# Patient Record
Sex: Female | Born: 1945 | Race: White | Hispanic: No | Marital: Married | Smoking: Former smoker
Health system: Southern US, Community
[De-identification: ages and names within clinical notes are randomized; demographics above are authoritative.]

## PROBLEM LIST (undated history)

## (undated) DIAGNOSIS — R55 Syncope and collapse: Secondary | ICD-10-CM

## (undated) DIAGNOSIS — F32A Depression, unspecified: Secondary | ICD-10-CM

## (undated) DIAGNOSIS — D61818 Other pancytopenia: Secondary | ICD-10-CM

## (undated) DIAGNOSIS — I4891 Unspecified atrial fibrillation: Secondary | ICD-10-CM

## (undated) DIAGNOSIS — E860 Dehydration: Secondary | ICD-10-CM

## (undated) DIAGNOSIS — L89152 Pressure ulcer of sacral region, stage 2: Secondary | ICD-10-CM

## (undated) DIAGNOSIS — G7000 Myasthenia gravis without (acute) exacerbation: Secondary | ICD-10-CM

## (undated) DIAGNOSIS — F419 Anxiety disorder, unspecified: Secondary | ICD-10-CM

## (undated) DIAGNOSIS — G20A1 Parkinson's disease without dyskinesia, without mention of fluctuations: Secondary | ICD-10-CM

## (undated) DIAGNOSIS — G20 Parkinson's disease: Secondary | ICD-10-CM

## (undated) DIAGNOSIS — E871 Hypo-osmolality and hyponatremia: Secondary | ICD-10-CM

## (undated) DIAGNOSIS — R1310 Dysphagia, unspecified: Secondary | ICD-10-CM

## (undated) DIAGNOSIS — E039 Hypothyroidism, unspecified: Secondary | ICD-10-CM

## (undated) DIAGNOSIS — N3000 Acute cystitis without hematuria: Secondary | ICD-10-CM

## (undated) DIAGNOSIS — F329 Major depressive disorder, single episode, unspecified: Secondary | ICD-10-CM

## (undated) HISTORY — DX: Hypo-osmolality and hyponatremia: E87.1

## (undated) HISTORY — DX: Depression, unspecified: F32.A

## (undated) HISTORY — DX: Dehydration: E86.0

## (undated) HISTORY — PX: ABDOMINAL HYSTERECTOMY: SHX81

## (undated) HISTORY — DX: Parkinson's disease: G20

## (undated) HISTORY — DX: Acute cystitis without hematuria: N30.00

## (undated) HISTORY — DX: Unspecified atrial fibrillation: I48.91

## (undated) HISTORY — DX: Pressure ulcer of sacral region, stage 2: L89.152

## (undated) HISTORY — DX: Hypomagnesemia: E83.42

## (undated) HISTORY — DX: Other pancytopenia: D61.818

## (undated) HISTORY — DX: Parkinson's disease without dyskinesia, without mention of fluctuations: G20.A1

## (undated) HISTORY — DX: Myasthenia gravis without (acute) exacerbation: G70.00

## (undated) HISTORY — DX: Anxiety disorder, unspecified: F41.9

## (undated) HISTORY — DX: Dysphagia, unspecified: R13.10

## (undated) HISTORY — DX: Syncope and collapse: R55

## (undated) HISTORY — DX: Hypothyroidism, unspecified: E03.9

## (undated) HISTORY — DX: Major depressive disorder, single episode, unspecified: F32.9

---

## 2008-07-12 ENCOUNTER — Encounter: Admission: RE | Admit: 2008-07-12 | Discharge: 2008-07-12 | Payer: Self-pay | Admitting: Family Medicine

## 2008-07-16 ENCOUNTER — Encounter: Admission: RE | Admit: 2008-07-16 | Discharge: 2008-07-16 | Payer: Self-pay | Admitting: Family Medicine

## 2009-07-14 ENCOUNTER — Encounter
Admission: RE | Admit: 2009-07-14 | Discharge: 2009-07-14 | Payer: Self-pay | Source: Home / Self Care | Admitting: Family Medicine

## 2010-06-06 ENCOUNTER — Encounter
Admission: RE | Admit: 2010-06-06 | Discharge: 2010-06-15 | Payer: Self-pay | Source: Home / Self Care | Attending: Family Medicine | Admitting: Family Medicine

## 2010-06-20 ENCOUNTER — Encounter
Admission: RE | Admit: 2010-06-20 | Discharge: 2010-07-18 | Payer: Self-pay | Source: Home / Self Care | Attending: Family Medicine | Admitting: Family Medicine

## 2010-07-08 ENCOUNTER — Other Ambulatory Visit: Payer: Self-pay | Admitting: Family Medicine

## 2010-07-08 DIAGNOSIS — Z1239 Encounter for other screening for malignant neoplasm of breast: Secondary | ICD-10-CM

## 2010-07-20 ENCOUNTER — Ambulatory Visit: Payer: BC Managed Care – HMO | Attending: Family Medicine | Admitting: Physical Therapy

## 2010-07-20 DIAGNOSIS — M542 Cervicalgia: Secondary | ICD-10-CM | POA: Insufficient documentation

## 2010-07-20 DIAGNOSIS — IMO0001 Reserved for inherently not codable concepts without codable children: Secondary | ICD-10-CM | POA: Insufficient documentation

## 2010-07-20 DIAGNOSIS — M2569 Stiffness of other specified joint, not elsewhere classified: Secondary | ICD-10-CM | POA: Insufficient documentation

## 2010-07-21 ENCOUNTER — Ambulatory Visit
Admission: RE | Admit: 2010-07-21 | Discharge: 2010-07-21 | Disposition: A | Discharge status: Home / Self Care | Payer: BC Managed Care – PPO | Source: Ambulatory Visit | Attending: Family Medicine | Admitting: Family Medicine

## 2010-07-21 DIAGNOSIS — Z1239 Encounter for other screening for malignant neoplasm of breast: Secondary | ICD-10-CM

## 2010-07-26 ENCOUNTER — Ambulatory Visit: Payer: BC Managed Care – HMO | Admitting: Physical Therapy

## 2010-07-26 ENCOUNTER — Encounter: Admit: 2010-07-26 | Payer: Self-pay | Admitting: Family Medicine

## 2010-07-28 ENCOUNTER — Ambulatory Visit: Payer: BC Managed Care – HMO | Admitting: Physical Therapy

## 2010-08-02 ENCOUNTER — Encounter: Payer: Self-pay | Admitting: Physical Therapy

## 2010-08-02 ENCOUNTER — Ambulatory Visit: Payer: BC Managed Care – HMO | Admitting: Rehabilitation

## 2010-08-04 ENCOUNTER — Encounter: Payer: Self-pay | Admitting: Physical Therapy

## 2010-08-04 ENCOUNTER — Ambulatory Visit: Payer: BC Managed Care – HMO | Admitting: Rehabilitation

## 2010-08-09 ENCOUNTER — Encounter: Payer: Self-pay | Admitting: Physical Therapy

## 2010-08-09 ENCOUNTER — Ambulatory Visit: Payer: BC Managed Care – HMO | Admitting: Rehabilitation

## 2010-08-11 ENCOUNTER — Ambulatory Visit: Payer: BC Managed Care – HMO | Admitting: Rehabilitation

## 2010-08-11 ENCOUNTER — Encounter: Payer: Self-pay | Admitting: Physical Therapy

## 2010-08-16 ENCOUNTER — Encounter: Payer: Self-pay | Admitting: Physical Therapy

## 2010-08-16 ENCOUNTER — Ambulatory Visit: Payer: BC Managed Care – HMO | Admitting: Rehabilitation

## 2010-08-18 ENCOUNTER — Encounter: Payer: Self-pay | Admitting: Physical Therapy

## 2010-08-23 ENCOUNTER — Ambulatory Visit: Payer: BC Managed Care – HMO | Attending: Family Medicine | Admitting: Rehabilitation

## 2010-08-23 DIAGNOSIS — M542 Cervicalgia: Secondary | ICD-10-CM | POA: Insufficient documentation

## 2010-08-23 DIAGNOSIS — IMO0001 Reserved for inherently not codable concepts without codable children: Secondary | ICD-10-CM | POA: Insufficient documentation

## 2010-08-23 DIAGNOSIS — M2569 Stiffness of other specified joint, not elsewhere classified: Secondary | ICD-10-CM | POA: Insufficient documentation

## 2011-05-17 ENCOUNTER — Ambulatory Visit: Payer: BC Managed Care – HMO | Attending: Family Medicine | Admitting: Physical Therapy

## 2011-05-17 DIAGNOSIS — IMO0001 Reserved for inherently not codable concepts without codable children: Secondary | ICD-10-CM | POA: Insufficient documentation

## 2011-05-17 DIAGNOSIS — M542 Cervicalgia: Secondary | ICD-10-CM | POA: Insufficient documentation

## 2011-05-17 DIAGNOSIS — M2569 Stiffness of other specified joint, not elsewhere classified: Secondary | ICD-10-CM | POA: Insufficient documentation

## 2011-05-23 ENCOUNTER — Ambulatory Visit: Payer: BC Managed Care – HMO | Attending: Family Medicine | Admitting: Physical Therapy

## 2011-05-23 DIAGNOSIS — M542 Cervicalgia: Secondary | ICD-10-CM | POA: Insufficient documentation

## 2011-05-23 DIAGNOSIS — IMO0001 Reserved for inherently not codable concepts without codable children: Secondary | ICD-10-CM | POA: Insufficient documentation

## 2011-05-23 DIAGNOSIS — M2569 Stiffness of other specified joint, not elsewhere classified: Secondary | ICD-10-CM | POA: Insufficient documentation

## 2011-05-29 ENCOUNTER — Ambulatory Visit: Payer: BC Managed Care – HMO | Admitting: Physical Therapy

## 2011-05-31 ENCOUNTER — Ambulatory Visit: Payer: BC Managed Care – HMO | Admitting: Physical Therapy

## 2011-06-05 ENCOUNTER — Ambulatory Visit: Payer: BC Managed Care – HMO | Admitting: Physical Therapy

## 2011-06-07 ENCOUNTER — Ambulatory Visit: Payer: BC Managed Care – HMO | Admitting: Physical Therapy

## 2011-06-13 ENCOUNTER — Ambulatory Visit: Payer: BC Managed Care – HMO | Admitting: Physical Therapy

## 2011-06-15 ENCOUNTER — Ambulatory Visit: Payer: BC Managed Care – HMO | Admitting: Physical Therapy

## 2011-06-26 ENCOUNTER — Ambulatory Visit: Payer: BC Managed Care – HMO | Attending: Family Medicine | Admitting: Physical Therapy

## 2011-06-26 ENCOUNTER — Other Ambulatory Visit (HOSPITAL_BASED_OUTPATIENT_CLINIC_OR_DEPARTMENT_OTHER): Payer: Self-pay | Admitting: Family Medicine

## 2011-06-26 DIAGNOSIS — M542 Cervicalgia: Secondary | ICD-10-CM | POA: Insufficient documentation

## 2011-06-26 DIAGNOSIS — IMO0001 Reserved for inherently not codable concepts without codable children: Secondary | ICD-10-CM | POA: Insufficient documentation

## 2011-06-26 DIAGNOSIS — M2569 Stiffness of other specified joint, not elsewhere classified: Secondary | ICD-10-CM | POA: Insufficient documentation

## 2011-06-26 DIAGNOSIS — Z1231 Encounter for screening mammogram for malignant neoplasm of breast: Secondary | ICD-10-CM

## 2011-06-28 ENCOUNTER — Ambulatory Visit: Payer: BC Managed Care – HMO | Admitting: Physical Therapy

## 2011-07-03 ENCOUNTER — Ambulatory Visit: Payer: BC Managed Care – HMO | Admitting: Physical Therapy

## 2011-07-05 ENCOUNTER — Ambulatory Visit: Payer: BC Managed Care – HMO | Admitting: Physical Therapy

## 2011-07-06 ENCOUNTER — Ambulatory Visit (HOSPITAL_BASED_OUTPATIENT_CLINIC_OR_DEPARTMENT_OTHER): Payer: BC Managed Care – HMO

## 2011-07-10 ENCOUNTER — Ambulatory Visit: Payer: BC Managed Care – HMO | Admitting: Physical Therapy

## 2011-07-12 ENCOUNTER — Ambulatory Visit: Payer: BC Managed Care – HMO | Admitting: Physical Therapy

## 2011-07-13 ENCOUNTER — Ambulatory Visit (HOSPITAL_BASED_OUTPATIENT_CLINIC_OR_DEPARTMENT_OTHER): Payer: BC Managed Care – HMO

## 2011-07-17 ENCOUNTER — Ambulatory Visit: Payer: BC Managed Care – HMO | Admitting: Physical Therapy

## 2011-07-19 ENCOUNTER — Ambulatory Visit: Payer: BC Managed Care – HMO | Admitting: Physical Therapy

## 2011-07-25 ENCOUNTER — Ambulatory Visit: Payer: BC Managed Care – HMO | Attending: Family Medicine | Admitting: Physical Therapy

## 2011-07-25 DIAGNOSIS — M2569 Stiffness of other specified joint, not elsewhere classified: Secondary | ICD-10-CM | POA: Insufficient documentation

## 2011-07-25 DIAGNOSIS — IMO0001 Reserved for inherently not codable concepts without codable children: Secondary | ICD-10-CM | POA: Insufficient documentation

## 2011-07-25 DIAGNOSIS — M542 Cervicalgia: Secondary | ICD-10-CM | POA: Insufficient documentation

## 2011-07-30 ENCOUNTER — Ambulatory Visit (HOSPITAL_BASED_OUTPATIENT_CLINIC_OR_DEPARTMENT_OTHER): Payer: BC Managed Care – HMO

## 2011-08-02 ENCOUNTER — Ambulatory Visit: Payer: BC Managed Care – HMO | Admitting: Physical Therapy

## 2011-08-03 ENCOUNTER — Ambulatory Visit (HOSPITAL_BASED_OUTPATIENT_CLINIC_OR_DEPARTMENT_OTHER): Payer: BC Managed Care – HMO

## 2011-08-07 ENCOUNTER — Ambulatory Visit: Payer: BC Managed Care – HMO | Admitting: Physical Therapy

## 2011-08-09 ENCOUNTER — Encounter: Payer: BC Managed Care – HMO | Admitting: Physical Therapy

## 2011-08-14 ENCOUNTER — Encounter: Payer: BC Managed Care – HMO | Admitting: Physical Therapy

## 2011-08-16 ENCOUNTER — Encounter: Payer: BC Managed Care – HMO | Admitting: Physical Therapy

## 2011-09-11 ENCOUNTER — Ambulatory Visit: Payer: BC Managed Care – HMO | Attending: Family Medicine | Admitting: Physical Therapy

## 2011-09-11 DIAGNOSIS — IMO0001 Reserved for inherently not codable concepts without codable children: Secondary | ICD-10-CM | POA: Insufficient documentation

## 2011-09-11 DIAGNOSIS — M542 Cervicalgia: Secondary | ICD-10-CM | POA: Insufficient documentation

## 2011-09-11 DIAGNOSIS — M2569 Stiffness of other specified joint, not elsewhere classified: Secondary | ICD-10-CM | POA: Insufficient documentation

## 2011-09-13 ENCOUNTER — Ambulatory Visit: Payer: BC Managed Care – HMO | Admitting: Physical Therapy

## 2011-09-19 ENCOUNTER — Ambulatory Visit: Payer: BC Managed Care – HMO | Attending: Family Medicine | Admitting: Physical Therapy

## 2011-09-19 DIAGNOSIS — M542 Cervicalgia: Secondary | ICD-10-CM | POA: Insufficient documentation

## 2011-09-19 DIAGNOSIS — M2569 Stiffness of other specified joint, not elsewhere classified: Secondary | ICD-10-CM | POA: Insufficient documentation

## 2011-09-19 DIAGNOSIS — IMO0001 Reserved for inherently not codable concepts without codable children: Secondary | ICD-10-CM | POA: Insufficient documentation

## 2011-09-21 ENCOUNTER — Ambulatory Visit: Payer: BC Managed Care – HMO | Admitting: Physical Therapy

## 2011-09-26 ENCOUNTER — Ambulatory Visit: Payer: BC Managed Care – HMO | Admitting: Physical Therapy

## 2011-09-28 ENCOUNTER — Ambulatory Visit: Payer: BC Managed Care – HMO | Admitting: Physical Therapy

## 2011-10-03 ENCOUNTER — Ambulatory Visit: Payer: BC Managed Care – HMO | Admitting: Physical Therapy

## 2011-10-05 ENCOUNTER — Ambulatory Visit: Payer: BC Managed Care – HMO | Admitting: Physical Therapy

## 2011-10-10 ENCOUNTER — Ambulatory Visit: Payer: BC Managed Care – HMO | Admitting: Physical Therapy

## 2011-10-12 ENCOUNTER — Ambulatory Visit: Payer: BC Managed Care – HMO | Admitting: Physical Therapy

## 2011-11-20 ENCOUNTER — Ambulatory Visit (HOSPITAL_BASED_OUTPATIENT_CLINIC_OR_DEPARTMENT_OTHER)
Admission: RE | Admit: 2011-11-20 | Discharge: 2011-11-20 | Disposition: A | Discharge status: Home / Self Care | Payer: BC Managed Care – HMO | Source: Ambulatory Visit | Attending: Family Medicine | Admitting: Family Medicine

## 2011-11-20 ENCOUNTER — Ambulatory Visit: Payer: BC Managed Care – HMO | Admitting: Physical Therapy

## 2011-11-20 DIAGNOSIS — Z1231 Encounter for screening mammogram for malignant neoplasm of breast: Secondary | ICD-10-CM | POA: Insufficient documentation

## 2011-11-27 ENCOUNTER — Ambulatory Visit: Payer: BC Managed Care – HMO | Attending: Family Medicine | Admitting: Physical Therapy

## 2011-11-27 DIAGNOSIS — M542 Cervicalgia: Secondary | ICD-10-CM | POA: Insufficient documentation

## 2011-11-27 DIAGNOSIS — IMO0001 Reserved for inherently not codable concepts without codable children: Secondary | ICD-10-CM | POA: Insufficient documentation

## 2011-11-27 DIAGNOSIS — M2569 Stiffness of other specified joint, not elsewhere classified: Secondary | ICD-10-CM | POA: Insufficient documentation

## 2011-12-25 ENCOUNTER — Ambulatory Visit: Payer: BC Managed Care – HMO | Attending: Family Medicine | Admitting: Physical Therapy

## 2011-12-25 DIAGNOSIS — IMO0001 Reserved for inherently not codable concepts without codable children: Secondary | ICD-10-CM | POA: Insufficient documentation

## 2011-12-25 DIAGNOSIS — M542 Cervicalgia: Secondary | ICD-10-CM | POA: Insufficient documentation

## 2011-12-25 DIAGNOSIS — M2569 Stiffness of other specified joint, not elsewhere classified: Secondary | ICD-10-CM | POA: Insufficient documentation

## 2012-02-04 ENCOUNTER — Ambulatory Visit: Payer: BC Managed Care – HMO | Admitting: Physical Therapy

## 2012-10-14 ENCOUNTER — Other Ambulatory Visit (HOSPITAL_BASED_OUTPATIENT_CLINIC_OR_DEPARTMENT_OTHER): Payer: Self-pay | Admitting: Family Medicine

## 2012-10-14 DIAGNOSIS — Z1239 Encounter for other screening for malignant neoplasm of breast: Secondary | ICD-10-CM

## 2012-12-03 ENCOUNTER — Ambulatory Visit (HOSPITAL_BASED_OUTPATIENT_CLINIC_OR_DEPARTMENT_OTHER): Payer: BC Managed Care – HMO

## 2012-12-12 ENCOUNTER — Ambulatory Visit (HOSPITAL_BASED_OUTPATIENT_CLINIC_OR_DEPARTMENT_OTHER): Payer: BC Managed Care – HMO

## 2013-01-01 ENCOUNTER — Ambulatory Visit (HOSPITAL_BASED_OUTPATIENT_CLINIC_OR_DEPARTMENT_OTHER): Payer: BC Managed Care – HMO

## 2013-01-19 ENCOUNTER — Ambulatory Visit (HOSPITAL_BASED_OUTPATIENT_CLINIC_OR_DEPARTMENT_OTHER): Payer: BC Managed Care – HMO

## 2016-05-28 DIAGNOSIS — D61818 Other pancytopenia: Secondary | ICD-10-CM

## 2016-05-28 HISTORY — DX: Other pancytopenia: D61.818

## 2016-05-28 LAB — HEPATIC FUNCTION PANEL
AST: 7 U/L — AB (ref 13–35)
Alkaline Phosphatase: 57 U/L (ref 25–125)
Bilirubin, Total: 1.5 mg/dL

## 2016-05-29 LAB — BASIC METABOLIC PANEL
BUN: 6 mg/dL (ref 4–21)
Creatinine: 0.4 mg/dL — AB (ref 0.5–1.1)
Glucose: 109 mg/dL
Potassium: 4.2 mmol/L (ref 3.4–5.3)
Sodium: 133 mmol/L — AB (ref 137–147)

## 2016-05-29 LAB — CBC AND DIFFERENTIAL
HCT: 24 % — AB (ref 36–46)
Hemoglobin: 8 g/dL — AB (ref 12.0–16.0)
Platelets: 72 10*3/uL — AB (ref 150–399)
WBC: 0.8 10^3/mL

## 2016-05-29 LAB — TSH: TSH: 1.45 u[IU]/mL (ref 0.41–5.90)

## 2016-05-30 LAB — CBC AND DIFFERENTIAL
HCT: 23 % — AB (ref 36–46)
HEMOGLOBIN: 7.8 g/dL — AB (ref 12.0–16.0)
Platelets: 71 10*3/uL — AB (ref 150–399)
WBC: 0.8 10*3/mL

## 2016-05-30 LAB — BASIC METABOLIC PANEL
BUN: 8 mg/dL (ref 4–21)
Creatinine: 0.4 mg/dL — AB (ref 0.5–1.1)
GLUCOSE: 111 mg/dL
POTASSIUM: 3.9 mmol/L (ref 3.4–5.3)
Sodium: 134 mmol/L — AB (ref 137–147)

## 2016-06-01 LAB — CBC AND DIFFERENTIAL
HCT: 23 % — AB (ref 36–46)
Hemoglobin: 7.6 g/dL — AB (ref 12.0–16.0)
PLATELETS: 64 10*3/uL — AB (ref 150–399)
WBC: 1 10*3/mL

## 2016-06-01 LAB — BASIC METABOLIC PANEL
BUN: 9 mg/dL (ref 4–21)
CREATININE: 0.4 mg/dL — AB (ref 0.5–1.1)
Glucose: 99 mg/dL
Potassium: 4.4 mmol/L (ref 3.4–5.3)
Sodium: 131 mmol/L — AB (ref 137–147)

## 2016-06-04 LAB — BASIC METABOLIC PANEL
BUN: 11 mg/dL (ref 4–21)
Creatinine: 0.5 mg/dL (ref 0.5–1.1)
Glucose: 103 mg/dL
Potassium: 4.8 mmol/L (ref 3.4–5.3)
Sodium: 133 mmol/L — AB (ref 137–147)

## 2016-06-04 LAB — CBC AND DIFFERENTIAL
HEMATOCRIT: 24 % — AB (ref 36–46)
HEMOGLOBIN: 8.1 g/dL — AB (ref 12.0–16.0)
Neutrophils Absolute: 0 /uL
PLATELETS: 95 10*3/uL — AB (ref 150–399)
WBC: 1.1 10*3/mL

## 2016-06-07 ENCOUNTER — Encounter: Payer: Self-pay | Admitting: Adult Health

## 2016-06-07 ENCOUNTER — Non-Acute Institutional Stay (SKILLED_NURSING_FACILITY): Payer: Medicare Other | Admitting: Adult Health

## 2016-06-07 DIAGNOSIS — T451X5A Adverse effect of antineoplastic and immunosuppressive drugs, initial encounter: Secondary | ICD-10-CM

## 2016-06-07 DIAGNOSIS — F329 Major depressive disorder, single episode, unspecified: Secondary | ICD-10-CM

## 2016-06-07 DIAGNOSIS — E538 Deficiency of other specified B group vitamins: Secondary | ICD-10-CM | POA: Diagnosis not present

## 2016-06-07 DIAGNOSIS — G7000 Myasthenia gravis without (acute) exacerbation: Secondary | ICD-10-CM | POA: Diagnosis not present

## 2016-06-07 DIAGNOSIS — R531 Weakness: Secondary | ICD-10-CM | POA: Insufficient documentation

## 2016-06-07 DIAGNOSIS — D61810 Antineoplastic chemotherapy induced pancytopenia: Secondary | ICD-10-CM | POA: Diagnosis not present

## 2016-06-07 DIAGNOSIS — E871 Hypo-osmolality and hyponatremia: Secondary | ICD-10-CM | POA: Diagnosis not present

## 2016-06-07 DIAGNOSIS — I4819 Other persistent atrial fibrillation: Secondary | ICD-10-CM

## 2016-06-07 DIAGNOSIS — G20 Parkinson's disease: Secondary | ICD-10-CM | POA: Diagnosis not present

## 2016-06-07 DIAGNOSIS — W19XXXS Unspecified fall, sequela: Secondary | ICD-10-CM | POA: Diagnosis not present

## 2016-06-07 DIAGNOSIS — K5901 Slow transit constipation: Secondary | ICD-10-CM

## 2016-06-07 DIAGNOSIS — Y92099 Unspecified place in other non-institutional residence as the place of occurrence of the external cause: Secondary | ICD-10-CM

## 2016-06-07 DIAGNOSIS — Y92009 Unspecified place in unspecified non-institutional (private) residence as the place of occurrence of the external cause: Principal | ICD-10-CM

## 2016-06-07 DIAGNOSIS — F32A Depression, unspecified: Secondary | ICD-10-CM

## 2016-06-07 DIAGNOSIS — R1310 Dysphagia, unspecified: Secondary | ICD-10-CM | POA: Diagnosis not present

## 2016-06-07 DIAGNOSIS — I481 Persistent atrial fibrillation: Secondary | ICD-10-CM | POA: Diagnosis not present

## 2016-06-07 DIAGNOSIS — E039 Hypothyroidism, unspecified: Secondary | ICD-10-CM

## 2016-06-07 NOTE — Progress Notes (Addendum)
DATE:  06/07/2016   MRN:  [id number]  BIRTHDAY: Nov 11, 1945  Facility:  Nursing Home Location:  [place] Room Number: 834-H  LEVEL OF CARE:  SNF (31)  Contact Information    Name Relation Home Work [place] Spouse [phone]     No name specified           Code Status History    This patient does not have a recorded code status. Please follow your organizational policy for patients in this situation.    Advance Directive Documentation   Flowsheet Row Most Recent Value  Type of Advance Directive  Out of facility DNR (pink MOST or yellow form)  Pre-existing out of facility DNR order (yellow form or pink MOST form)  No data  "MOST" Form in Place?  No data       Chief Complaint  Patient presents with  . Hospitalization Follow-up    HISTORY OF PRESENT ILLNESS:  This is a 70 year old female admitted to [facility] on 06/06/16 for short-term rehabilitation after a hospitalization at [facility] 05/28/16-06/06/16 for presyncope with fall and pancytopenia.  She first presented at OSH with workup significant for hemoglobin 5.2, WBC 0.7 and sodium 119. Chest x-ray showed bilateral pleural effusions and an large cardiac silhouette. CT head was negative for evidence of acute intracranial abnormality. Due to patient's significant pancytopenia she was transferred to [facility] for further evaluation and management. She was transfused 3 units packed RBCs and given IV fluids. Folate was 5.7 and folate supplementation was initiated. Bone marrow biopsy was done 12/12 which was inconsistent with acute leukemia, hypocellular marrow with this any total pole OS C6 which can be seen in Imuran use and MDS. Imuran and trimethoprim were held due to possible side effects of bone marrow suppression. She will follow-up with hematology outpatient. ECG showed atrial fibrillation and was started on metoprolol succinate 25 mg daily.  Anticoagulation was held due to pancytopenia. She takes Sinemet for Parkinson's disease. Sinemet dosage was decreased due to concern for medication side effects contributing to pancytopenia. She was given cefuroxime I'm 5 days for UTI.  She has been admitted for a short-term rehabilitation.  She was seen in the room with her husband at bedside. She complained of constipation, no bowel movement 5 days.   PAST MEDICAL HISTORY:  Past Medical History:  Diagnosis Date  . Acute cystitis without hematuria   . Anxiety   . Decubitus ulcer of coccygeal region, stage 2   . Dehydration with hyponatremia   . Depression   . Dysphagia   . Hypomagnesemia   . Hypothyroidism   . Myasthenia gravis ([place])   . New onset atrial fibrillation ([place])   . Pancytopenia ([place]) 05/28/2016  . Parkinson's disease ([place])   . Syncope and collapse      CURRENT MEDICATIONS: Reviewed  Patient's Medications  New Prescriptions   No medications on file  Previous Medications   ACETAMINOPHEN (TYLENOL) 650 MG CR TABLET    Take 650 mg by mouth every 6 (six) hours.   BACLOFEN (LIORESAL) 10 MG TABLET    Take 10 mg by mouth daily.   CALCIUM-VITAMIN D (OSCAL WITH D) 500-200 MG-UNIT TABLET    Take 1 tablet by mouth daily with breakfast.   CARBIDOPA-LEVODOPA (SINEMET CR) 50-200 MG TABLET    Take 1 tablet by mouth at bedtime.   CARBIDOPA-LEVODOPA (SINEMET IR) 25-100 MG TABLET    Take 2 tablets by  mouth 4 (four) times daily.   CRANBERRY CONCENTRATE PO    Take 2,000 mg by mouth daily.   CYANOCOBALAMIN 1000 MCG TABLET    Take 1,000 mcg by mouth daily.   CYCLOBENZAPRINE (FLEXERIL) 5 MG TABLET    Take 2.5 mg by mouth every 6 (six) hours as needed for muscle spasms. 1/2 tablet to = 2.5 mg   FOLIC ACID (FOLVITE) 1 MG TABLET    Take 5 mg by mouth daily. Take 5 tablets to = 5 mg qd for supplement   LEVOTHYROXINE (SYNTHROID, LEVOTHROID) 100 MCG TABLET    Take 100 mcg by mouth daily before breakfast.   METOPROLOL SUCCINATE (TOPROL-XL) 25  MG 24 HR TABLET    Take 25 mg by mouth daily.   NUTRITIONAL SUPPLEMENT LIQD    Take 120 mLs by mouth 2 (two) times daily between meals.   PRAMIPEXOLE (MIRAPEX) 1.5 MG TABLET    Take 1.5 mg by mouth 3 (three) times daily.   PREDNISONE (DELTASONE) 5 MG TABLET    Take 5 mg by mouth daily with breakfast.   SENNOSIDES-DOCUSATE SODIUM (SENOKOT-S) 8.6-50 MG TABLET    Take 2 tablets by mouth at bedtime.   SERTRALINE (ZOLOFT) 50 MG TABLET    Take 50 mg by mouth 2 (two) times daily.   TRAMADOL (ULTRAM) 50 MG TABLET    Take 50 mg by mouth every 6 (six) hours as needed for moderate pain.  Modified Medications   No medications on file  Discontinued Medications   No medications on file     No Known Allergies   REVIEW OF SYSTEMS:  GENERAL: no change in appetite, no fatigue, no weight changes, no fever, chills or weakness EYES: Denies change in vision, dry eyes, eye pain, itching or discharge EARS: Denies change in hearing, ringing in ears, or earache NOSE: Denies nasal congestion or epistaxis MOUTH and THROAT: Denies oral discomfort, gingival pain or bleeding, pain from teeth or hoarseness   RESPIRATORY: no cough, SOB, DOE, wheezing, hemoptysis CARDIAC: no chest pain, edema or palpitations GI: no abdominal pain, diarrhea, heart burn, nausea or vomiting, + constipation GU: Denies dysuria, frequency, hematuria, incontinence, or discharge PSYCHIATRIC: Denies feeling of depression or anxiety. No report of hallucinations, insomnia, paranoia, or agitation     PHYSICAL EXAMINATION  GENERAL APPEARANCE: Well nourished. In no acute distress. Normal body habitus SKIN:  Skin is warm and dry.  HEAD: Normal in size and contour. No evidence of trauma EYES: Lids open and close normally. No blepharitis, entropion or ectropion. PERRL. Conjunctivae are clear and sclerae are white. Lenses are without opacity EARS: Pinnae are normal. Patient hears normal voice tunes of the examiner MOUTH and THROAT: Lips are  without lesions. Oral mucosa is moist and without lesions. Tongue is normal in shape, size, and color and without lesions NECK: supple, trachea midline, no neck masses, no thyroid tenderness, no thyromegaly LYMPHATICS: no LAN in the neck, no supraclavicular LAN RESPIRATORY: breathing is even & unlabored, BS CTAB CARDIAC: RRR, no murmur,no extra heart sounds, no edema GI: abdomen soft, normal BS, no masses, no tenderness, no hepatomegaly, no splenomegaly EXTREMITIES:  Able to move 4 extremities PSYCHIATRIC: Alert and oriented X 3. Affect and behavior are appropriate    LABS/RADIOLOGY: Labs reviewed: Basic Metabolic Panel:  Recent Labs  05/29/16 05/30/16 06/01/16  NA 133* 134* 131*  K 4.2 3.9 4.4  BUN 6 8 9   CREATININE 0.4* 0.4* 0.4*   Liver Function Tests:  Recent Labs  05/28/16  AST 7*  ALKPHOS 57   CBC:  Recent Labs  05/29/16 05/30/16 06/01/16  WBC 0.8 0.8 1.0  HGB 8.0* 7.8* 7.6*  HCT 24* 23* 23*  PLT 72* 71* 64*    ASSESSMENT/PLAN:  Generalized weakness - for rehabilitation, PT and OT, for therapeutic strengthening exercises; fall precaution  S/P fall @ home; likely due to significant anemia and dehydration; MRI done on 11/30 showed multilevel cervical spondylosis without significant canal stenosis and mild multilevel left greater than right foraminal narrowing; head CT from OSH was negative for acute intracranial abnormalities; S/P transfusion of 3 units packed RBCs and IV fluids were given; continue Ultram 50 mg 1 tab by mouth every 6 hours when necessary; physiatry consult  Pancytopenia - S/P transfusion of 3 units packed RBCs; folate 5.7, low, folate supplementation was initiated; S/P bone marrow biopsy on 12/12 was inconsistent with acute leukemia, hypocellular marrow with this every 3-4 W. cysts which can be seen in Imuran and use of MDS; Imuran and trimethoprim were held; follow-up with hematology; check CBC  Atrial fibrillation, new onset - was started on  metoprolol succinate 25 mg daily, systemic anticoagulation held due to pancytopenia  Parkinson's disease - continue Sinemet and follow-up with Dr. Freida Busman, neurology at [facility]; continue baclofen, cyclobenzaprine and Mirapex  Myasthenia gravis - follows up with Dr. Freida Busman, neurology at [facility] scheduled for 06/28/16; Imuran and trimethoprim were held due to possible side effects contributing to pancytopenia; continue prednisone 5 mg 1 tab by mouth daily  Dysphagia - ST evaluation and treatment for safe swallowing  Hyponatremia - Na 131; check BMP  Constipation - start MiraLAX 17 g by mouth twice a day, senna S 8.6-50 mg 2 tabs by mouth twice a day and Dulcolax suppository 10 mg 1 rectally daily when necessary  Hypothyroidism - continue Synthroid 100 g 1 tab by mouth daily Lab Results  Component Value Date   TSH 1.45 05/29/2016   Depression - continue Zoloft 50 mg 1 tab by mouth twice a day  Folic acid deficiency - continue folic acid 1 mg 5  Tabs = 5 mg  by mouth daily     Goals of care:  Short-term rehabilitation    Marchell Froman C. [place]k - NP [employer] [phone]

## 2016-06-08 ENCOUNTER — Non-Acute Institutional Stay (SKILLED_NURSING_FACILITY): Payer: Medicare Other | Admitting: Internal Medicine

## 2016-06-08 ENCOUNTER — Encounter: Payer: Self-pay | Admitting: Internal Medicine

## 2016-06-08 DIAGNOSIS — F411 Generalized anxiety disorder: Secondary | ICD-10-CM | POA: Diagnosis not present

## 2016-06-08 DIAGNOSIS — D61818 Other pancytopenia: Secondary | ICD-10-CM | POA: Diagnosis not present

## 2016-06-08 DIAGNOSIS — M47812 Spondylosis without myelopathy or radiculopathy, cervical region: Secondary | ICD-10-CM

## 2016-06-08 DIAGNOSIS — D649 Anemia, unspecified: Secondary | ICD-10-CM

## 2016-06-08 DIAGNOSIS — G20 Parkinson's disease: Secondary | ICD-10-CM | POA: Diagnosis not present

## 2016-06-08 DIAGNOSIS — L8992 Pressure ulcer of unspecified site, stage 2: Secondary | ICD-10-CM

## 2016-06-08 DIAGNOSIS — K5909 Other constipation: Secondary | ICD-10-CM

## 2016-06-08 DIAGNOSIS — G7000 Myasthenia gravis without (acute) exacerbation: Secondary | ICD-10-CM | POA: Diagnosis not present

## 2016-06-08 DIAGNOSIS — R5381 Other malaise: Secondary | ICD-10-CM

## 2016-06-08 DIAGNOSIS — E039 Hypothyroidism, unspecified: Secondary | ICD-10-CM

## 2016-06-08 DIAGNOSIS — R1312 Dysphagia, oropharyngeal phase: Secondary | ICD-10-CM

## 2016-06-08 LAB — CBC AND DIFFERENTIAL
HEMATOCRIT: 25 % — AB (ref 36–46)
HEMOGLOBIN: 8.6 g/dL — AB (ref 12.0–16.0)
PLATELETS: 249 10*3/uL (ref 150–399)
WBC: 1.5 10*3/mL

## 2016-06-08 LAB — BASIC METABOLIC PANEL
BUN: 13 mg/dL (ref 4–21)
Creatinine: 0.4 mg/dL — AB (ref 0.5–1.1)
Glucose: 103 mg/dL
Potassium: 4 mmol/L (ref 3.4–5.3)
Sodium: 136 mmol/L — AB (ref 137–147)

## 2016-06-08 NOTE — Progress Notes (Signed)
LOCATION: [place]  PCP: Tawanna Solo, MD   Code Status: DNR  Goals of care: Advanced Directive information Advanced Directives 06/07/2016  Type of Advance Directive Out of facility DNR (pink MOST or yellow form)  Does patient want to make changes to medical advance directive? No - Patient declined       Extended Emergency Contact Information Primary Emergency Contact: Kelby, [place] Address: [address]          [place], [place] Phone: [phone] Relation: Spouse   No Known Allergies  Chief Complaint  Patient presents with  . New Admit To SNF    New Admission Visit      HPI:  Patient is a 70 y.o. female seen today for short term rehabilitation post hospital admission from 05/28/2016-06/06/2016 with presyncope and a fall. CT head was negative for acute intracranial abnormality. Workup in the hospital showed pancytopenia. She required 3 units PRBC transfusion and IV fluids. She was also started on folic acid supplement. She underwent bone marrow biopsy on 05/29/2016 with no clear etiology identified. Her Imuran and trimethoprim were thought to be contributing to pancytopenia and they were held. Her Sinemet dose was also adjusted. She is to follow-up with hematology as outpatient. She was also complaining of neck pain and underwent MRI showing cervical spondylosis without canal stenosis. She also had new onset A. fib and required AV nodal controlling agent. She was not placed on any anticoagulation because of pancytopenia. She was noted to have mild oropharyngeal dysphagia and was placed on dysphagia diet. She has medical history of Parkinson's disease, myasthenia gravis, hypertension, anxiety among others. She is seen in her room today.  Review of Systems:  Constitutional: Negative for fever, chills, diaphoresis.  she feels weak and tired. HENT: Negative for headache, congestion, nasal discharge, hearing loss.Eyes: Negative for  blurred vision, double vision and discharge.  Respiratory: Negative for cough, shortness of breath and wheezing.   Cardiovascular: Negative for chest pain, palpitations, leg swelling.  Gastrointestinal: Negative for heartburn, nausea, vomiting, abdominal pain. Positive for poor appetite. Last bowel movement was 5 days ago. She uses stool softener at home. Genitourinary: Negative for dysuria.  Musculoskeletal: Negative for fall in the facility. Positive for neck pain.  Skin: Negative for itching, rash.  Neurological: Negative for dizziness. Psychiatric/Behavioral: Negative for depression. Positive for anxiety.  Past Medical History:  Diagnosis Date  . Acute cystitis without hematuria   . Anxiety   . Decubitus ulcer of coccygeal region, stage 2   . Dehydration with hyponatremia   . Depression   . Dysphagia   . Hypomagnesemia   . Hypothyroidism   . Myasthenia gravis ([place])   . New onset atrial fibrillation ([place])   . Pancytopenia ([place]) 05/28/2016  . Parkinson's disease ([place])   . Syncope and collapse    No past surgical history on file. Social History:   reports that she quit smoking about 39 years ago. Her smoking use included Cigarettes. She has never used smokeless tobacco. She reports that she does not drink alcohol or use drugs.  No family history on file.  Medications: Allergies as of 06/08/2016   No Known Allergies     Medication List       Accurate as of 06/08/16  2:25 PM. Always use your most recent med list.          acetaminophen 650 MG CR tablet Commonly known as:  TYLENOL Take 650 mg by mouth every 6 (  six) hours.   baclofen 10 MG tablet Commonly known as:  LIORESAL Take 10 mg by mouth daily.   bisacodyl 10 MG suppository Commonly known as:  DULCOLAX Place 10 mg rectally daily as needed for moderate constipation.   calcium-vitamin D 500-200 MG-UNIT tablet Commonly known as:  OSCAL WITH D Take 1 tablet by mouth daily with breakfast.     carbidopa-levodopa 50-200 MG tablet Commonly known as:  SINEMET CR Take 1 tablet by mouth at bedtime.   carbidopa-levodopa 25-100 MG tablet Commonly known as:  SINEMET IR Take 2 tablets by mouth 4 (four) times daily.   CRANBERRY CONCENTRATE PO Take 2,000 mg by mouth daily.   cyanocobalamin 1000 MCG tablet Take 1,000 mcg by mouth daily.   cyclobenzaprine 5 MG tablet Commonly known as:  FLEXERIL Take 2.5 mg by mouth every 6 (six) hours as needed for muscle spasms. 1/2 tablet to = 2.5 mg   folic acid 1 MG tablet Commonly known as:  FOLVITE Take 5 mg by mouth daily. Take 5 tablets to = 5 mg qd for supplement   ICY HOT 5 % Ptch Generic drug:  Menthol Apply 1 application topically 2 (two) times daily.   levothyroxine 100 MCG tablet Commonly known as:  SYNTHROID, LEVOTHROID Take 100 mcg by mouth daily before breakfast.   metoprolol succinate 25 MG 24 hr tablet Commonly known as:  TOPROL-XL Take 25 mg by mouth daily.   NUTRITIONAL SUPPLEMENT Liqd Take 120 mLs by mouth 2 (two) times daily between meals.   polyethylene glycol packet Commonly known as:  MIRALAX / GLYCOLAX Take 17 g by mouth 2 (two) times daily.   pramipexole 1.5 MG tablet Commonly known as:  MIRAPEX Take 1.5 mg by mouth 3 (three) times daily.   predniSONE 5 MG tablet Commonly known as:  DELTASONE Take 5 mg by mouth daily with breakfast.   sennosides-docusate sodium 8.6-50 MG tablet Commonly known as:  SENOKOT-S Take 2 tablets by mouth 2 (two) times daily.   sertraline 50 MG tablet Commonly known as:  ZOLOFT Take 50 mg by mouth 2 (two) times daily.   traMADol 50 MG tablet Commonly known as:  ULTRAM Take 50 mg by mouth every 6 (six) hours as needed for moderate pain.       Immunizations: Immunization History  Administered Date(s) Administered  . PPD Test 06/06/2016     Physical Exam: Vitals:   06/08/16 1419  BP: (!) 121/58  Pulse: (!) 59  Resp: 20  Temp: 97.2 F (36.2 C)  TempSrc:  Oral  SpO2: 98%  Weight: 145 lb (65.8 kg)  Height: _0  (1.626 m)   Body mass index is 24.89 kg/m.  General- elderly female, well built, in no acute distress Head- normocephalic, atraumatic Nose- no maxillary or frontal sinus tenderness, no nasal discharge Throat- moist mucus membrane Eyes- PERRLA, EOMI, no pallor, no icterus, no discharge, normal conjunctiva, normal sclera Neck- no cervical lymphadenopathy Cardiovascular- normal s1,s2, no murmur Respiratory- bilateral clear to auscultation, no wheeze, no rhonchi, no crackles, no use of accessory muscles Abdomen- bowel sounds present, soft, non tender, no guarding or rigidity, no CVA tenderness Musculoskeletal- able to move all 4 extremities, generalized weakness, left paracervical tenderness, arthritis changes to her fingers, no leg edema Neurological- alert and oriented to person, place and time, resting tremors to both her hands Skin- warm and dry, easy bruising Psychiatry- normal mood and affect    Labs reviewed: Basic Metabolic Panel:  Recent Labs  05/30/16 06/01/16 06/04/16  NA 134* 131* 133*  K 3.9 4.4 4.8  BUN _0 CREATININE 0.4* 0.4* 0.5   Liver Function Tests:  Recent Labs  05/28/16  AST 7*  ALKPHOS 57   No results for input(s): LIPASE, AMYLASE in the last 8760 hours. No results for input(s): AMMONIA in the last 8760 hours. CBC:  Recent Labs  05/30/16 06/01/16 06/04/16  WBC 0.8 1.0 1.1  NEUTROABS  --   --  0  HGB 7.8* 7.6* 8.1*  HCT 23* 23* 24*  PLT 71* 64* 95*   Cardiac Enzymes: No results for input(s): CKTOTAL, CKMB, CKMBINDEX, TROPONINI in the last 8760 hours. BNP: Invalid input(s): POCBNP CBG: No results for input(s): GLUCAP in the last 8760 hours.  Radiological Exams: MRI Cervical Spine 05/17/16  Impression. No acute fracture or malalignment. Mild multilevel cervical spondylosis without significant canal stenosis.   Assessment/Plan  Physical deconditioning The generalized  weakness. Will have her work with physical therapy and occupational therapy to help regain her strength and balance. Fall precautions to be taken.  Pancytopenia Offending medications have been held. Patient to follow-up with hematoloy. Continue vitamin J01 and folic acid supplement.  Anemia No active plate reported. Likely from her pancytopenia. She is status post 3 units packed red blood cell transfusion in the hospital. Monitor H&H  Anxiety Will get psychiatric consult to evaluate further. Currently on Zoloft 50 mg daily. Increase Zoloft to 75 mg daily. Also had Xanax 0.5 mg every 12 hours as needed for CVA or anxiety for one week. Monitor  Cervical spondylosis Constipating to her neck pain. PMR to follow. Continue Ultram 50 mg every 6 hours as needed for pain. Currently on baclofen 10 mg at bedtime and Flexeril on a needed basis. Monitor clinically.  Constipation Chronic. Continue MiraLAX twice a day and senna S2 tabs twice a day with Dulcolax daily as needed. Monitor. Encourage hydration.  Oropharyngeal dysphagia Continue mechanical soft diet with aspiration precautions. Speech therapist to follow.   Stage II pressure ulcer Provide wound care and to continue pressure ulcer prophylaxis.  Hypothyroidism Continue her home regimen Synthroid  Myasthenia gravis Continue prednisone 5 mg daily chronically. Will have her work with physical and occupational therapy.  Parkinson's disease Continue her Sinemet and pramipexole current regimen.      Goals of care: short term rehabilitation   Labs/tests ordered:  Family/ staff Communication: reviewed care plan with patient and nursing supervisor    Blanchie Serve, MD Internal Medicine [place],  [address] Cell Phone (Monday-Friday 8 am - 5 pm): [phone] On Call: [phone] and follow prompts after 5 pm and on weekends Office Phone: [phone] Office Fax:  [phone]

## 2016-06-13 ENCOUNTER — Non-Acute Institutional Stay (SKILLED_NURSING_FACILITY): Payer: Medicare Other | Admitting: Internal Medicine

## 2016-06-13 ENCOUNTER — Encounter: Payer: Self-pay | Admitting: Internal Medicine

## 2016-06-13 DIAGNOSIS — M542 Cervicalgia: Secondary | ICD-10-CM

## 2016-06-13 DIAGNOSIS — M62838 Other muscle spasm: Secondary | ICD-10-CM | POA: Diagnosis not present

## 2016-06-13 LAB — CBC AND DIFFERENTIAL
HCT: 27 % — AB (ref 36–46)
HEMOGLOBIN: 8.9 g/dL — AB (ref 12.0–16.0)
Neutrophils Absolute: 1 /uL
PLATELETS: 276 10*3/uL (ref 150–399)
WBC: 2.4 10*3/mL

## 2016-06-13 LAB — BASIC METABOLIC PANEL
BUN: 14 mg/dL (ref 4–21)
CREATININE: 0.4 mg/dL — AB (ref 0.5–1.1)
GLUCOSE: 105 mg/dL
POTASSIUM: 4.1 mmol/L (ref 3.4–5.3)
Sodium: 140 mmol/L (ref 137–147)

## 2016-06-13 NOTE — Progress Notes (Signed)
LOCATION: [facility]  PCP: Neldon Labella, MD   Code Status: DNR  Goals of care: Advanced Directive information Advanced Directives 06/07/2016  Type of Advance Directive Out of facility DNR (pink MOST or yellow form)  Does patient want to make changes to medical advance directive? No - Patient declined       Extended Emergency Contact Information Primary Emergency Contact: Kimbrely, Buckel Address: [address]          [place], [place] Home Phone: [phone] Relation: Spouse   No Known Allergies  Chief Complaint  Patient presents with  . Acute Visit    Uncontrolled pain     HPI:  Patient is a 70 y.o. female seen today for Acute visit. She complains of pain along with muscle tightness to her left cervical region that has worsened.she is here for short term rehabilitation post hospital admission from 05/28/2016-06/06/2016 with presyncope and a fall. She had MRI c-spine showing cervical spondylosis without canal stenosis. Her husband is present during the visit at bedside.  Review of Systems:  Constitutional: Negative for fever, chills, diaphoresis.  she feels weak and tired. HENT: Negative for headache, congestion Eyes: Negative for double vision and discharge.  Respiratory: Negative for cough, shortness of breath Cardiovascular: Negative for chest pain Gastrointestinal: Negative for nausea, vomiting, abdominal pain.  Musculoskeletal: Negative for fall or trauma to neck area in the facility.  Skin: Negative for itching, rash.  Neurological: Negative for dizziness, numbness or tingling of her arm or fingers. Psychiatric/Behavioral: Negative for depression.    Past Medical History:  Diagnosis Date  . Acute cystitis without hematuria   . Anxiety   . Decubitus ulcer of coccygeal region, stage 2   . Dehydration with hyponatremia   . Depression   . Dysphagia   . Hypomagnesemia   . Hypothyroidism   . Myasthenia gravis (HCC)     . New onset atrial fibrillation (HCC)   . Pancytopenia (HCC) 05/28/2016  . Parkinson's disease (HCC)   . Syncope and collapse    No past surgical history on file.   Medications: Allergies as of 06/13/2016   No Known Allergies     Medication List       Accurate as of 06/13/16 10:24 AM. Always use your most recent med list.          acetaminophen 650 MG CR tablet Commonly known as:  TYLENOL Take 650 mg by mouth every 6 (six) hours as needed for pain (headache).   ALPRAZolam 0.5 MG tablet Commonly known as:  XANAX Take 0.5 mg by mouth 2 (two) times daily as needed for anxiety.   baclofen 10 MG tablet Commonly known as:  LIORESAL Take 10 mg by mouth daily.   bisacodyl 10 MG suppository Commonly known as:  DULCOLAX Place 10 mg rectally daily as needed for moderate constipation.   calcium-vitamin D 500-200 MG-UNIT tablet Commonly known as:  OSCAL WITH D Take 1 tablet by mouth daily with breakfast.   carbidopa-levodopa 50-200 MG tablet Commonly known as:  SINEMET CR Take 1 tablet by mouth at bedtime.   carbidopa-levodopa 25-100 MG tablet Commonly known as:  SINEMET IR Take 2 tablets by mouth 4 (four) times daily.   CRANBERRY CONCENTRATE PO Take 2,000 mg by mouth daily.   cyanocobalamin 1000 MCG tablet Take 1,000 mcg by mouth daily.   cyclobenzaprine 5 MG tablet Commonly known as:  FLEXERIL Take 2.5 mg by mouth every 6 (six) hours as needed  for muscle spasms. 1/2 tablet to = 2.5 mg   folic acid 1 MG tablet Commonly known as:  FOLVITE Take 5 mg by mouth daily. Take 5 tablets to = 5 mg qd for supplement   ICY HOT 5 % Ptch Generic drug:  Menthol Apply 1 application topically 2 (two) times daily.   levothyroxine 100 MCG tablet Commonly known as:  SYNTHROID, LEVOTHROID Take 100 mcg by mouth daily before breakfast.   metoprolol succinate 25 MG 24 hr tablet Commonly known as:  TOPROL-XL Take 25 mg by mouth daily.   NUTRITIONAL SUPPLEMENT Liqd Take 120  mLs by mouth 2 (two) times daily between meals.   polyethylene glycol packet Commonly known as:  MIRALAX / GLYCOLAX Take 17 g by mouth 2 (two) times daily.   pramipexole 1.5 MG tablet Commonly known as:  MIRAPEX Take 1.5 mg by mouth 3 (three) times daily.   predniSONE 5 MG tablet Commonly known as:  DELTASONE Take 5 mg by mouth daily with breakfast.   sennosides-docusate sodium 8.6-50 MG tablet Commonly known as:  SENOKOT-S Take 2 tablets by mouth 2 (two) times daily.   sertraline 50 MG tablet Commonly known as:  ZOLOFT Take 75 mg by mouth daily.   traMADol 50 MG tablet Commonly known as:  ULTRAM Take 50 mg by mouth every 6 (six) hours as needed for moderate pain.       Immunizations: Immunization History  Administered Date(s) Administered  . PPD Test 06/06/2016     Physical Exam: Vitals:   06/13/16 1016  BP: 112/69  Pulse: 94  Resp: 20  Temp: 97.9 F (36.6 C)  TempSrc: Oral  SpO2: 98%  Weight: 145 lb (65.8 kg)  Height: 5\' 4"  (1.626 m)   Body mass index is 24.89 kg/m.  General- elderly female, well built, in no acute distress Head- normocephalic, atraumatic Throat- moist mucus membrane Eyes- PERRLA, EOMI, no pallor, no icterus Neck- no cervical lymphadenopathy Cardiovascular- normal s1,s2, no murmur Respiratory- bilateral clear to auscultation Abdomen- bowel sounds present, soft, non tender Musculoskeletal- able to move all 4 extremities, generalized weakness, left paracervical tenderness on palpation with muscle spasticity noted, arthritis changes to her fingers, no leg edema Neurological- alert and oriented to person, place and time Skin- warm and dry, easy bruising Psychiatry- normal mood and affect    Labs reviewed: Basic Metabolic Panel:  Recent Labs  40/98/1112/15/17 06/04/16 06/08/16  NA 131* 133* 136*  K 4.4 4.8 4.0  BUN 9 11 13   CREATININE 0.4* 0.5 0.4*   Liver Function Tests:  Recent Labs  05/28/16  AST 7*  ALKPHOS 57   No results  for input(s): LIPASE, AMYLASE in the last 8760 hours. No results for input(s): AMMONIA in the last 8760 hours. CBC:  Recent Labs  06/01/16 06/04/16 06/08/16  WBC 1.0 1.1 1.5  NEUTROABS  --  0  --   HGB 7.6* 8.1* 8.6*  HCT 23* 24* 25*  PLT 64* 95* 249   Cardiac Enzymes: No results for input(s): CKTOTAL, CKMB, CKMBINDEX, TROPONINI in the last 8760 hours. BNP: Invalid input(s): POCBNP CBG: No results for input(s): GLUCAP in the last 8760 hours.  Radiological Exams: MRI Cervical Spine 05/17/16  Impression. No acute fracture or malalignment. Mild multilevel cervical spondylosis without significant canal stenosis.   Assessment/Plan  Cervicalgia From cervical spondylosis and muscle spasm. Currently on tramadol 50 mg every 6 hours as needed, baclofen 10 mg daily at bedtime and Flexeril 2.5 mg every 6 hours as needed along  with icy hot patch to the left neck region. PMR on board but will not be at the facility this week. Given her muscle spasm, provide Flexeril 1 now and change her baclofen to 10 mg twice a day. Ice pack 3 times a day to the left neck region. Monitor clinically  Neck muscle spasm Due to cervical spondylosis. No neurological signs at present. Change the dosing of baclofen as above and monitor on a daily basis for signs of improvement versus worsening     Goals of care: short term rehabilitation   Labs/tests ordered: none  Family/ staff Communication: reviewed care plan with patient, her husband and nursing supervisor    Oneal GroutMAHIMA Lynk Marti, MD [facility] [address] [place], [place] [address] Cell Phone (Monday-Friday 8 am - 5 pm): [phone] On Call: [phone] and follow prompts after 5 pm and on weekends Office Phone: [phone] Office Fax: [phone]

## 2016-06-22 ENCOUNTER — Non-Acute Institutional Stay (SKILLED_NURSING_FACILITY): Payer: Medicare Other | Admitting: Adult Health

## 2016-06-22 ENCOUNTER — Encounter: Payer: Self-pay | Admitting: Adult Health

## 2016-06-22 DIAGNOSIS — F411 Generalized anxiety disorder: Secondary | ICD-10-CM

## 2016-06-22 DIAGNOSIS — D61818 Other pancytopenia: Secondary | ICD-10-CM

## 2016-06-22 DIAGNOSIS — E039 Hypothyroidism, unspecified: Secondary | ICD-10-CM | POA: Diagnosis not present

## 2016-06-22 DIAGNOSIS — R5381 Other malaise: Secondary | ICD-10-CM | POA: Diagnosis not present

## 2016-06-22 DIAGNOSIS — G7000 Myasthenia gravis without (acute) exacerbation: Secondary | ICD-10-CM

## 2016-06-22 DIAGNOSIS — R1310 Dysphagia, unspecified: Secondary | ICD-10-CM | POA: Diagnosis not present

## 2016-06-22 DIAGNOSIS — F329 Major depressive disorder, single episode, unspecified: Secondary | ICD-10-CM | POA: Diagnosis not present

## 2016-06-22 DIAGNOSIS — M47812 Spondylosis without myelopathy or radiculopathy, cervical region: Secondary | ICD-10-CM

## 2016-06-22 DIAGNOSIS — I481 Persistent atrial fibrillation: Secondary | ICD-10-CM | POA: Diagnosis not present

## 2016-06-22 DIAGNOSIS — G20 Parkinson's disease: Secondary | ICD-10-CM

## 2016-06-22 DIAGNOSIS — D649 Anemia, unspecified: Secondary | ICD-10-CM

## 2016-06-22 DIAGNOSIS — F32A Depression, unspecified: Secondary | ICD-10-CM

## 2016-06-22 DIAGNOSIS — I4819 Other persistent atrial fibrillation: Secondary | ICD-10-CM

## 2016-06-22 DIAGNOSIS — K5909 Other constipation: Secondary | ICD-10-CM | POA: Diagnosis not present

## 2016-06-22 DIAGNOSIS — E538 Deficiency of other specified B group vitamins: Secondary | ICD-10-CM

## 2016-06-22 NOTE — Progress Notes (Signed)
DATE:  06/22/2016   MRN:  [id number]  BIRTHDAY: June 09, 1946  Facility:  [facility] Location:  [facility] Room Number: 546-T  LEVEL OF CARE:  SNF [phone])  Contact Information    Name Relation Home Work [place] Spouse [phone]  [phone]   Airiel, Oblinger   [phone]   Annaleia, Pence   [phone]   No name specified           Code Status History    This patient does not have a recorded code status. Please follow your organizational policy for patients in this situation.    Advance Directive Documentation   Flowsheet Row Most Recent Value  Type of Advance Directive  Out of facility DNR (pink MOST or yellow form)  Pre-existing out of facility DNR order (yellow form or pink MOST form)  No data  "MOST" Form in Place?  No data       Chief Complaint  Patient presents with  . Discharge Note    HISTORY OF PRESENT ILLNESS:  This is a 40-YO female seen for a discharge visit.  She was admitted to [facility] on 06/06/16 for short-term rehabilitation following an admission from [facility] from 05/28/16-06/06/16 S/P fall secondary to presyncope and weakness. She is being discharged with home health PT, OT, CNA, ST, and nursing services.    Patient was admitted to this facility for short-term rehabilitation after the patient's recent hospitalization.  Patient has completed SNF rehabilitation and therapy has cleared the patient for discharge.   PAST MEDICAL HISTORY:  Past Medical History:  Diagnosis Date  . Acute cystitis without hematuria   . Anxiety   . Decubitus ulcer of coccygeal region, stage 2   . Dehydration with hyponatremia   . Depression   . Dysphagia   . Hypomagnesemia   . Hypothyroidism   . Myasthenia gravis ([place])   . New onset atrial fibrillation ([place])   . Pancytopenia ([place]) 05/28/2016  . Parkinson's disease ([place])   . Syncope and collapse      CURRENT MEDICATIONS: Reviewed    Patient's Medications  New Prescriptions   No medications on file  Previous Medications   ACETAMINOPHEN (TYLENOL) 650 MG CR TABLET    Take 650 mg by mouth every 6 (six) hours as needed for pain (headache).    ALPRAZOLAM (XANAX) 0.5 MG TABLET    Take 0.5 mg by mouth daily as needed for anxiety.    BACLOFEN (LIORESAL) 10 MG TABLET    Take 10 mg by mouth 2 (two) times daily. Give at 10A and 10P   BISACODYL (DULCOLAX) 10 MG SUPPOSITORY    Place 10 mg rectally daily as needed for moderate constipation.   CALCIUM-VITAMIN D (OSCAL WITH D) 500-200 MG-UNIT TABLET    Take 1 tablet by mouth daily with breakfast.   CARBIDOPA-LEVODOPA (SINEMET CR) 50-200 MG TABLET    Take 1 tablet by mouth at bedtime.   CARBIDOPA-LEVODOPA (SINEMET IR) 25-100 MG TABLET    Take 2 tablets by mouth 4 (four) times daily.   CRANBERRY CONCENTRATE PO    Take 2,000 mg by mouth daily.   CYANOCOBALAMIN 1000 MCG TABLET    Take 1,000 mcg by mouth daily.   CYCLOBENZAPRINE (FLEXERIL) 5 MG TABLET    Take 2.5 mg by mouth every 6 (six) hours as needed for muscle spasms. 1/2 tablet to = 2.5 mg   FOLIC ACID (FOLVITE) 1 MG TABLET  Take 5 mg by mouth daily. Take 5 tablets to = 5 mg qd for supplement   LEVOTHYROXINE (SYNTHROID, LEVOTHROID) 100 MCG TABLET    Take 100 mcg by mouth daily before breakfast.   MENTHOL (ICY HOT) 5 % PTCH    Apply 1 application topically daily. Apply to left scapula at 6AM, remove at 6PM   METOPROLOL SUCCINATE (TOPROL-XL) 25 MG 24 HR TABLET    Take 25 mg by mouth daily.   NUTRITIONAL SUPPLEMENT LIQD    Take 120 mLs by mouth 2 (two) times daily between meals. [employer]   POLYETHYLENE GLYCOL (MIRALAX / GLYCOLAX) PACKET    Take 17 g by mouth 2 (two) times daily.   PRAMIPEXOLE (MIRAPEX) 1.5 MG TABLET    Take 1.5 mg by mouth 3 (three) times daily.   PREDNISONE (DELTASONE) 5 MG TABLET    Take 5 mg by mouth daily with breakfast.   SENNOSIDES-DOCUSATE SODIUM (SENOKOT-S) 8.6-50 MG TABLET    Take 2 tablets by mouth 2 (two) times  daily.    SERTRALINE (ZOLOFT) 50 MG TABLET    Take 75 mg by mouth daily. Take a 25 mg tablet along with a 50 mg tablet to = 75 mg qd   TRAMADOL (ULTRAM) 50 MG TABLET    Take 50 mg by mouth every 6 (six) hours as needed for moderate pain.  Modified Medications   No medications on file  Discontinued Medications   No medications on file     No Known Allergies   REVIEW OF SYSTEMS:  GENERAL: no change in appetite, no fatigue, no weight changes, no fever, chills or weakness EYES: Denies change in vision, dry eyes, eye pain, itching or discharge EARS: Denies change in hearing, ringing in ears, or earache NOSE: Denies nasal congestion or epistaxis MOUTH and THROAT: Denies oral discomfort, gingival pain or bleeding, pain from teeth or hoarseness   RESPIRATORY: no cough, SOB, DOE, wheezing, hemoptysis CARDIAC: no chest pain, edema or palpitations GI: no abdominal pain, diarrhea, constipation, heart burn, nausea or vomiting GU: Denies dysuria, frequency, hematuria, incontinence, or discharge PSYCHIATRIC: Denies feeling of depression or anxiety. No report of hallucinations, insomnia, paranoia, or agitation    PHYSICAL EXAMINATION  GENERAL APPEARANCE: Well nourished. In no acute distress. Normal body habitus SKIN:  Skin is warm and dry.  HEAD: Normal in size and contour. No evidence of trauma EYES: Lids open and close normally. No blepharitis, entropion or ectropion. PERRL. Conjunctivae are clear and sclerae are white. Lenses are without opacity EARS: Pinnae are normal. Patient hears normal voice tunes of the examiner MOUTH and THROAT: Lips are without lesions. Oral mucosa is moist and without lesions. Tongue is normal in shape, size, and color and without lesions NECK: supple, trachea midline, no neck masses, no thyroid tenderness, no thyromegaly LYMPHATICS: no LAN in the neck, no supraclavicular LAN RESPIRATORY: breathing is even & unlabored, BS CTAB, kyphotic CARDIAC: irregular heart  rate, no murmur,no extra heart sounds, no edema GI: abdomen soft, normal BS, no masses, no tenderness, no hepatomegaly, no splenomegaly EXTREMITIES:  Able to move X 4 extremities, walks with walker PSYCHIATRIC: Alert and oriented X 3. Affect and behavior are appropriate   LABS/RADIOLOGY: Labs reviewed: Basic Metabolic Panel:  Recent Labs  06/04/16 06/08/16 06/13/16  NA 133* 136* 140  K 4.8 4.0 4.1  BUN 11 13 14   CREATININE 0.5 0.4* 0.4*   Liver Function Tests:  Recent Labs  05/28/16  AST 7*  ALKPHOS 57   CBC:  Recent Labs  06/04/16 06/08/16 06/13/16  WBC 1.1 1.5 2.4  NEUTROABS 0  --  1  HGB 8.1* 8.6* 8.9*  HCT 24* 25* 27*  PLT 95* 249 276    ASSESSMENT/PLAN:  Physical deconditioning - for Home health PT and OT, for therapeutic strengthening exercises; fall precaution  Cervical spondylosis - S/P fall @ home; likely due to significant anemia and dehydration; MRI done on 11/30 showed multilevel cervical spondylosis without significant canal stenosis and mild multilevel left greater than right foraminal narrowing; head CT from OSH was negative for acute intracranial abnormalities; continue Ultram 50 mg 1 tab by mouth every 6 hours when necessary, Icy Hot 5% 1 patch to left scapular area at 6 AM for pain; Baclofen 10 mg 1 tab PO BID and  2.5 mg PO Q 6 hours PRN for muscle spasm  Pancytopenia - S/P transfusion of 3 units packed RBCs; continue folic acid 60m PO Q D; S/P bone marrow biopsy on 12/12 was inconsistent with acute leukemia; follow-up with hematology  Atrial fibrillation, new onset - rate-controlled; continue metoprolol succinate 25 mg daily, systemic anticoagulation held due to pancytopenia  Parkinson's disease - continue Sinemet and [facility] continue baclofen, cyclobenzaprine and Mirapex  Myasthenia gravis - [facility]; Imuran and trimethoprim were held due to possible  side effects contributing to pancytopenia; continue prednisone 5 mg 1 tab by mouth daily  Dysphagia - for Home health ST follow-up   Chronic constipation - continue MiraLAX 17 g by mouth twice a day, senna S 8.6-50 mg 2 tabs by mouth twice a day and Dulcolax suppository 10 mg 1 rectally daily when necessary  Hypothyroidism - continue Synthroid 100 g 1 tab by mouth daily Lab Results  Component Value Date   TSH 1.45 05/29/2016   Depression - continue Zoloft from  75 mg by mouth daily  Folic acid deficiency - continue folic acid 1 mg 5  Tabs = 5 mg  by mouth daily   Anxiety - mood is stable; continue Xanax 0.5 mg 1 tab by mouth daily when necessary till 06/29/16 then discontinue  Anemia -  Follow-up with hematology Lab Results  Component Value Date   HGB 8.9 (A) 06/13/2016       I have filled out patient's discharge paperwork and written prescriptions.  Patient will receive home health PT, OT, ST, Nursing and CNA.  DME provided:  Bedside commode  Total discharge time: Greater than 30 minutes Greater than 50% was spent in counseling and coordination of care.  Discharge time involved coordination of the discharge process with social worker, nursing staff and therapy department. Medical justification for home health services/DME verified.    Iley Breeden C. MLykens- NP [phone]

## 2016-06-26 ENCOUNTER — Other Ambulatory Visit: Payer: Self-pay | Admitting: Adult Health

## 2016-07-01 ENCOUNTER — Other Ambulatory Visit: Payer: Self-pay | Admitting: Adult Health

## 2016-07-16 ENCOUNTER — Other Ambulatory Visit: Payer: Self-pay | Admitting: Adult Health

## 2016-10-18 NOTE — Progress Notes (Signed)
Psychiatric Initial Adult Assessment   Patient Identification: Janice Hampton MRN:  [id number] Date of Evaluation:  10/22/2016 Referral Source: Dr. Susa Simmonds Chief Complaint:   Chief Complaint    Anxiety; New Evaluation     Visit Diagnosis:    ICD-9-CM ICD-10-CM   1. Generalized anxiety disorder 300.02 F41.1 Ambulatory referral to Psychology  2. Panic disorder 300.01 F41.0 Ambulatory referral to Psychology    History of Present Illness:   Janice Hampton is a 71 year old female with depression, anxiety, Parkinson's disease, Myasthenia gravis, hypothyroidism, cervical spondylosis who is referred for anxiety.   Patient states that she feels anxious and is fear of dying by choking. She believes it has gotten worse over the past month without any triggers. Although she loves to eat and enjoys meals during the day, she is unable to eat at night. She reports that it tends to happen only at night or early in the morning. She had an episode during this interview, when she states that she is "dying." She states that she feels unsafe and the symptoms would not be gone for one hour. She has not been able to go to church due to her anxiety, as she would never know when it happens. She also feels more anxious when her husband leaves the house, as she does not want to die alone. She reports that she has been an anxious person since childhood, but has never been this anxious. She feels great support from her husband who presents to the interview.   She reports depressed mood due to her physical condition. She has insomnia due to anxiety. She denies anhedonia and enjoys Scientific laboratory technician. She reports good energy. She denies SI. She denies decreased need for sleep or euphoria. She denies gambling. She reports panic attack occasionally and takes clonazepam one to twice per day. Although she used to have hallucinations of seeing people, it improved after started on quetiapine since March 2018. She denies VH. She denies  decreased need for sleep or euphoria. She uses a walker at home. She is feared of fall. Her Sinemet dose was decreased in Dec 2017 due to concern of her CBC per report (and was admitted in ICU for 10 days). She complains of neck stiffness. She denies alcohol use or drug use.  Associated Signs/Symptoms: Depression Symptoms:  insomnia, anxiety, panic attacks, (Hypo) Manic Symptoms:  denies Anxiety Symptoms:  Excessive Worry, Panic Symptoms, Psychotic Symptoms:  deneis PTSD Symptoms: NA  Past Psychiatric History:  Outpatient: denies Psychiatry admission: denies  Previous suicide attempt: denies Past trials of medication: paroxetine (dry mouth), clonazepam History of violence: denies  Previous Psychotropic Medications: Yes   Substance Abuse History in the last 12 months:  No.  Consequences of Substance Abuse: NA  Past Medical History:  Past Medical History:  Diagnosis Date  . Acute cystitis without hematuria   . Anxiety   . Decubitus ulcer of coccygeal region, stage 2   . Dehydration with hyponatremia   . Depression   . Dysphagia   . Hypomagnesemia   . Hypothyroidism   . Myasthenia gravis (HCC)   . New onset atrial fibrillation (HCC)   . Pancytopenia (HCC) 05/28/2016  . Parkinson's disease (HCC)   . Syncope and collapse    No past surgical history on file.  Family Psychiatric History:  denies  Family History: No family history on file.  Social History:   Social History   Social History  . Marital status: Married  Spouse name: N/A  . Number of children: N/A  . Years of education: N/A   Social History Main Topics  . Smoking status: Former Smoker    Types: Cigarettes    Quit date: 05/07/1977  . Smokeless tobacco: Never Used  . Alcohol use No  . Drug use: No  . Sexual activity: Not Currently   Other Topics Concern  . None   Social History Narrative  . None    Additional Social History:  Work: used to work for [employer], retired at age 89 She is  originally from [place],  moved to [place] 17 years ago duet o her husband job.  She is married for 42 years and has two children.  Education: graduated from [employer]  Allergies:  No Known Allergies  Metabolic Disorder Labs: No results found for: HGBA1C, MPG No results found for: PROLACTIN No results found for: CHOL, TRIG, HDL, CHOLHDL, VLDL, LDLCALC   Current Medications: Current Outpatient Prescriptions  Medication Sig Dispense Refill  . acetaminophen (TYLENOL) 325 MG tablet Take 650 mg by mouth 2 (two) times daily as needed.    . baclofen (LIORESAL) 10 MG tablet Take 10 mg by mouth 4 (four) times daily. Give at 10A and 10P    . calcium-vitamin D (OSCAL WITH D) 500-200 MG-UNIT tablet Take 1 tablet by mouth daily with breakfast.    . carbidopa-levodopa (SINEMET CR) 50-200 MG tablet Take 1 tablet by mouth at bedtime.    . carbidopa-levodopa (SINEMET IR) 25-100 MG tablet Take by mouth. At 6 AM taking 2 tablets, at 10 am taking 2.5 tablets, At 2 pm taking 2 Tablets, At 6 pm taking 2 tablets, Around 2am-4am PRN Taking 1 Tablet.    . Cholecalciferol (VITAMIN D3 PO) Take 1,000 mg by mouth daily.    . clonazePAM (KLONOPIN) 0.5 MG tablet Take 0.5 mg by mouth 2 (two) times daily as needed for anxiety.    Marland Kitchen. CRANBERRY CONCENTRATE PO Take 800 mg by mouth daily.     Marland Kitchen. levothyroxine (SYNTHROID, LEVOTHROID) 100 MCG tablet Take 100 mcg by mouth daily before breakfast.    . Menthol (ICY HOT) 5 % PTCH Apply 1 application topically daily. Apply to left scapula at 6AM, remove at [facility]6PM    . metoprolol succinate (TOPROL-XL) 25 MG 24 hr tablet Take 25 mg by mouth daily.    . Multiple Vitamins-Minerals (CENTRUM SILVER 50+WOMEN PO) Take by mouth daily.    . polyethylene glycol (MIRALAX / GLYCOLAX) packet Take 17 g by mouth. Drinking one Glass in AM and PRN in PM    . pramipexole (MIRAPEX) 1.5 MG tablet Take 1.5 mg by mouth. Taking 0.5 tablet in AM and 1 Tablet at noon    . predniSONE (DELTASONE) 5 MG tablet Take 5  mg by mouth 2 (two) times daily with a meal.     . QUEtiapine (SEROQUEL) 25 MG tablet Take 12.5 mg by mouth at bedtime.    . sennosides-docusate sodium (SENOKOT-S) 8.6-50 MG tablet Take 2 tablets by mouth 2 (two) times daily.     . traMADol (ULTRAM) 50 MG tablet Take 50 mg by mouth every 6 (six) hours as needed for moderate pain.    . DULoxetine (CYMBALTA) 20 MG capsule Take 1 capsule (20 mg total) by mouth daily. Put in apple sauce or apple juice 30 capsule 1   No current facility-administered medications for this visit.     Neurologic: Headache: No Seizure: No Paresthesias:No  Musculoskeletal: Strength & Muscle Tone: decreased Gait &  Station: in a wheelchair Patient leans: N/A  Psychiatric Specialty Exam: Review of Systems  Musculoskeletal: Positive for myalgias and neck pain.  Psychiatric/Behavioral: Positive for depression. Negative for hallucinations, substance abuse and suicidal ideas. The patient is nervous/anxious and has insomnia.   All other systems reviewed and are negative.   Blood pressure (!) 159/92, pulse 67, height 5\' 4"  (1.626 m).There is no height or weight on file to calculate BMI.  General Appearance: Well Groomed  Eye Contact:  Good  Speech:  Clear and Coherent  Volume:  Normal  Mood:  Anxious  Affect:  Flat but reactive  Thought Process:  Coherent and Goal Directed  Orientation:  Full (Time, Place, and Person)  Thought Content:  Logical Perceptions: denies AH/VH  Suicidal Thoughts:  No  Homicidal Thoughts:  No  Memory:  Immediate;   Good Recent;   Good Remote;   Good  Judgement:  Good  Insight:  Good  Psychomotor Activity:  Normal  Concentration:  Concentration: Good and Attention Span: Good  Recall:  Good  Fund of Knowledge:Good  Language: Good  Akathisia:  No  Handed:  Right  AIMS (if indicated):  N/A  Assets:  Communication Skills Desire for Improvement  ADL's:  Intact  Cognition: WNL  Sleep:  poor   Assessment Janice Hampton is a 71  year old female with depression, anxiety, Parkinson's disease, Myasthenia gravis, hypothyroidism, cervical spondylosis who is referred for anxiety.   # GAD # Panic disorder Exam is notable for her anticipatory anxiety and cognitive distortion of catastrophizing. Will discontinue Paxil given its side effect of dry mouth. Will start duloxetine to target her mood and pain. Will start from lower dose to avoid side effect; risk of nausea, vomiting and muscle spasms were discussed. Will continue clonazepam prn for anxiety. Patient is advised to discuss with her neurologist regarding her sense of choking, as it occurs only on certain time (I wonder if there is any correlation to wearing off from her medication). She will greatly benefit from CBT to target her anxiety and supportive therapy for demoralization secondary to her physical condition. Will make a referral.   Plan 1. Start duloxetine 20 mg daily (Put in apple sauce or apple juice) 2. Continue clonazepam 0.5 twice a day as needed for anxiety 3. Discontinue Paxil 4. Continue quetiapine 12.5 mg at night  5. Return to clinic in one month for 30 mins 6. Referral for therapy, [phone] (behavioral health in [place])   The patient demonstrates the following risk factors for suicide: Chronic risk factors for suicide include: psychiatric disorder of anxiety and chronic pain. Acute risk factors for suicide include: unemployment. Protective factors for this patient include: positive social support, responsibility to others (children, family), coping skills and hope for the future. Considering these factors, the overall suicide risk at this point appears to be low. Patient is appropriate for outpatient follow up.   Treatment Plan Summary: Plan as above  Orders Placed This Encounter  Procedures  . Ambulatory referral to Psychology    Referral Priority:   Routine    Referral Type:   Psychiatric    Referral Reason:   Specialty Services Required     Requested Specialty:   Psychology    Number of Visits Requested:   1    Neysa Hotter, MD 5/7/20185:11 PM

## 2016-10-22 ENCOUNTER — Ambulatory Visit (INDEPENDENT_AMBULATORY_CARE_PROVIDER_SITE_OTHER): Payer: Medicare Other | Admitting: Psychiatry

## 2016-10-22 ENCOUNTER — Encounter (HOSPITAL_COMMUNITY): Payer: Self-pay | Admitting: Psychiatry

## 2016-10-22 VITALS — BP 159/92 | HR 67 | Ht 64.0 in

## 2016-10-22 DIAGNOSIS — F411 Generalized anxiety disorder: Secondary | ICD-10-CM

## 2016-10-22 DIAGNOSIS — G20 Parkinson's disease: Secondary | ICD-10-CM

## 2016-10-22 DIAGNOSIS — F410 Panic disorder [episodic paroxysmal anxiety] without agoraphobia: Secondary | ICD-10-CM | POA: Diagnosis not present

## 2016-10-22 DIAGNOSIS — Z87891 Personal history of nicotine dependence: Secondary | ICD-10-CM | POA: Diagnosis not present

## 2016-10-22 DIAGNOSIS — G7000 Myasthenia gravis without (acute) exacerbation: Secondary | ICD-10-CM | POA: Diagnosis not present

## 2016-10-22 DIAGNOSIS — M47892 Other spondylosis, cervical region: Secondary | ICD-10-CM | POA: Diagnosis not present

## 2016-10-22 DIAGNOSIS — E039 Hypothyroidism, unspecified: Secondary | ICD-10-CM | POA: Diagnosis not present

## 2016-10-22 DIAGNOSIS — Z79899 Other long term (current) drug therapy: Secondary | ICD-10-CM

## 2016-10-22 MED ORDER — DULOXETINE HCL 20 MG PO CPEP: 20.0000 mg | ORAL_CAPSULE | Freq: Every day | ORAL | 1 refills | Status: DC

## 2016-10-22 NOTE — Patient Instructions (Addendum)
1. Start duloxetine 20 mg daily (Put in apple sauce or apple juice) 2. Continue clonazepam 0.5 twice a day as needed for anxiety 3. Discontinue Paxil 4. Continue quetiapine 12.5 mg at night  5. Return to clinic in one month for 30 mins 6. Referral for therapy, [phone]

## 2016-10-23 ENCOUNTER — Telehealth (HOSPITAL_COMMUNITY): Payer: Self-pay | Admitting: Psychiatry

## 2016-10-23 NOTE — Telephone Encounter (Signed)
Chart reviewed in care everywhere.   She was diagnosed with Parkinson's disease in 2013 and myasthenia gravis in 2014. 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 She was hospitalized in Dec 2017 with severe cytopenias and had a bone marrow biopsy, which showed hypocellular bone marrow, with minimal  dyspoiesis and normal cytogenetics. It was felt that the cause of pancytopenia was Imuran, which was discontinued. Her Sinemet dose was lowered during the admission, which has improved her appetite, nausea, sleepiness, lightheadedness. She has been having night time wakings due to akinesia, adding extra Sinemet tab at 3am to chew if she wakens has helped her get back to sleep.  Per note written by Dr. Mickie Bail, [name], neurologist "Despite these improvements, she still feels she is doing poorly. She admits to primarily behavioral wearing off during the day, however she has deep motor off upon waking up at 6am. She has significant anxiety, which limits her independent walking. On exam today, I felt her gait was slightly improved and was mostly struck by the severe anxiety that developed upon standing. She began to report light-headedness BEFORE she stood up, just at the thought of standing. 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 I checked her BP sitting and standing, there was no change."   She was started on quetiapine for hallucinations. She use to be on sertraline, which appeared to be switched to Paxil by her PCP, which patient discontinued due to feeling that her throat to close up.

## 2016-11-05 ENCOUNTER — Telehealth (HOSPITAL_COMMUNITY): Payer: Self-pay

## 2016-11-06 NOTE — Telephone Encounter (Signed)
Discussed with patient and her husband on speaker phone. She reports worsening in her insomnia and she had "freeze" at night, where she could not move at all. She states that it has not happened before. She reports no change in her anxiety. She has been taking the content of duloxetine 20 mg daily with apple sauce (which was instructed by a pharmacist) for the past two weeks. She is instructed to discontinue duloxetine given concern for its side effect. Will not start psychotropics at this time until her side effect resolves. Discussed serotonin discontinuation symptoms; she is instructed to take 10 mg for three days then discontinue if she were to experience these symptoms. She is advised to contact the clinic if she has other questions.

## 2016-11-19 NOTE — Progress Notes (Signed)
[facility] MD/PA/NP OP Progress Note  11/22/2016 4:20 PM Janice Hampton  MRN:  [id number]  Chief Complaint:  Chief Complaint    Anxiety; Follow-up     Subjective:  "I'm scared" HPI:  - duloxetine was tapered off since the last appointment due to adverse reaction - Per care everywhere, she was seen by neurologist on 5/24; Carbidopa-Levodopa dose was increased in the morning, and baclofen dose in the morning was discontinued with concern for exacerbating anxiety  Patient presents for follow up appointment. She states that she continues to have sensation of closing of her throat in the morning, and feels it has been getting worse. After discussion with her husband who reports less of her symptoms since having additional dose of sinemet, she agrees that she might be feeling more overwhelmed than what he actually sees. She feels frustration of not being able to go outside or having dinner with her friends due to fear of "episode." Although she has been walking at home, it has been getting more difficult due to her fear.   She reports insomnia. She has increased appetite, although her actual meal intake tend to be less than before. She enjoys doing Sudoku. She feels fatigue. She denies SI, HI, AH/VH. She complained dry mouth when she used to be on duloxetine.   Janice Hampton, her husband presents to the interview.  He believes that she appears to do better after having additional Sinemet. He is not aware that she has insomnia. He did not see any change since discontinuing morning dose of baclofen.   Chart reviewed in care everywhere.   She was diagnosed with Parkinson's disease in 2013 and myasthenia gravis in 2014. 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 She was hospitalized in Dec 2017 with severe cytopenias and had a bone marrow biopsy, which showed hypocellular bone marrow, with minimal  dyspoiesis and normal cytogenetics.  It was felt that the cause of pancytopenia was Imuran, which was discontinued. Her Sinemet dose was lowered during the admission, which has improved her appetite, nausea, sleepiness, lightheadedness. She has been having night time awakening due to akinesia, adding extra Sinemet tab at 3am to chew if she wakens has helped her get back to sleep.  Per note written by Dr. Mickie Hampton, Janice Hampton, neurologist "Despite these improvements, she still feels she is doing poorly. She admits to primarily behavioral wearing off during the day, however she has deep motor off upon waking up at 6am. She has significant anxiety, which limits her independent walking. On exam today, I felt her gait was slightly improved and was mostly struck by the severe anxiety that developed upon standing. She began to report light-headedness BEFORE she stood up, just at the thought of standing. 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 I checked her BP sitting and standing, there was no change."   She was started on quetiapine for hallucinations. She use to be on sertraline, which appeared to be switched to Paxil by her PCP, which patient discontinued due to feeling that her throat to close up.   Visit Diagnosis:    ICD-10-CM   1. Generalized anxiety disorder F41.1   2. Panic disorder F41.0     Past Psychiatric History:  Outpatient: denies Psychiatry admission: denies  Previous suicide attempt: denies Past trials of medication: paroxetine (dry mouth), clonazepam History of violence: denies  Past Medical History:  Past Medical History:  Diagnosis Date  . Acute cystitis without hematuria   .  Anxiety   . Decubitus ulcer of coccygeal region, stage 2   . Dehydration with hyponatremia   . Depression   . Dysphagia   . Hypomagnesemia   . Hypothyroidism   . Myasthenia gravis ([place])   . New onset atrial fibrillation ([place])   . Pancytopenia ([place]) 05/28/2016  . Parkinson's disease ([place])   . Syncope and collapse    No past surgical  history on file.  Family Psychiatric History:  denies  Family History: No family history on file.  Social History:  Social History   Social History  . Marital status: Married    Spouse name: N/A  . Number of children: N/A  . Years of education: N/A   Social History Main Topics  . Smoking status: Former Smoker    Types: Cigarettes    Quit date: 05/07/1977  . Smokeless tobacco: Never Used  . Alcohol use No  . Drug use: No  . Sexual activity: Not Currently   Other Topics Concern  . None   Social History Narrative  . None   Work: used to work for [employer], retired at age 2 She is originally from [place],  moved to [place] 17 years ago 23 o her husband job.  She is married for 42 years and has two children.  Education: graduated from [employer]  Allergies: No Known Allergies  Metabolic Disorder Labs: No results found for: HGBA1C, MPG No results found for: PROLACTIN No results found for: CHOL, TRIG, HDL, CHOLHDL, VLDL, LDLCALC   Current Medications: Current Outpatient Prescriptions  Medication Sig Dispense Refill  . acetaminophen (TYLENOL) 325 MG tablet Take 650 mg by mouth 2 (two) times daily as needed.    . baclofen (LIORESAL) 10 MG tablet Take 10 mg by mouth 4 (four) times daily. Give at 10A and 10P    . calcium-vitamin D (OSCAL WITH D) 500-200 MG-UNIT tablet Take 1 tablet by mouth daily with breakfast.    . carbidopa-levodopa (SINEMET CR) 50-200 MG tablet Take 1 tablet by mouth at bedtime.    . carbidopa-levodopa (SINEMET IR) 25-100 MG tablet Take by mouth. At 6 AM taking 2 tablets, at 10 am taking 2.5 tablets, At 2 pm taking 2 Tablets, At 6 pm taking 2 tablets, Around 2am-4am PRN Taking 1 Tablet.    . Cholecalciferol (VITAMIN D3 PO) Take 1,000 mg by mouth daily.    . clonazePAM (KLONOPIN) 0.5 MG tablet Take 1 tablet (0.5 mg total) by mouth 2 (two) times daily as needed for anxiety. 60 tablet 0  . CRANBERRY CONCENTRATE PO Take 800 mg by mouth daily.     Marland Kitchen levothyroxine  (SYNTHROID, LEVOTHROID) 100 MCG tablet Take 100 mcg by mouth daily before breakfast.    . Menthol (ICY HOT) 5 % PTCH Apply 1 application topically daily. Apply to left scapula at 6AM, remove at [facility]    . metoprolol succinate (TOPROL-XL) 25 MG 24 hr tablet Take 25 mg by mouth daily.    . Multiple Vitamins-Minerals (CENTRUM SILVER 50+WOMEN PO) Take by mouth daily.    . polyethylene glycol (MIRALAX / GLYCOLAX) packet Take 17 g by mouth. Drinking one Glass in AM and PRN in PM    . pramipexole (MIRAPEX) 1.5 MG tablet Take 1.5 mg by mouth. Taking 0.5 tablet in AM and 1 Tablet at noon    . predniSONE (DELTASONE) 5 MG tablet Take 5 mg by mouth 2 (two) times daily with a meal.     . QUEtiapine (SEROQUEL) 25 MG tablet Take 12.5  mg by mouth at bedtime.    . sennosides-docusate sodium (SENOKOT-S) 8.6-50 MG tablet Take 2 tablets by mouth 2 (two) times daily.     . traMADol (ULTRAM) 50 MG tablet Take 50 mg by mouth every 6 (six) hours as needed for moderate pain.    . mirtazapine (REMERON) 15 MG tablet Take 0.5 tablets (7.5 mg total) by mouth at bedtime. 45 tablet 0   No current facility-administered medications for this visit.     Neurologic: Headache: No Seizure: No Paresthesias: No  Musculoskeletal: Strength & Muscle Tone: decreased Gait & Station: on wheelchair Patient leans: N/A  Psychiatric Specialty Exam: Review of Systems  Musculoskeletal: Positive for myalgias and neck pain.  Psychiatric/Behavioral: Negative for depression, hallucinations, substance abuse and suicidal ideas. The patient is nervous/anxious and has insomnia.   All other systems reviewed and are negative.   Blood pressure (!) 102/30, pulse 73, height 5' 4"  (1.626 m), SpO2 98 %.There is no height or weight on file to calculate BMI.  General Appearance: Fairly Groomed  Eye Contact:  Good  Speech:  Clear and Coherent  Volume:  Normal  Mood:  Anxious  Affect:  Congruent, Full Range and anxious, tearful at times  Thought  Process:  Coherent and Goal Directed  Orientation:  Full (Time, Place, and Person)  Thought Content: Logical Perceptions: denies AH/VH  Suicidal Thoughts:  No  Homicidal Thoughts:  No  Memory:  Immediate;   Good Recent;   Good Remote;   Good  Judgement:  Good  Insight:  Fair  Psychomotor Activity:  Normal  Concentration:  Concentration: Good and Attention Span: Good  Recall:  Good  Fund of Knowledge: Good  Language: Good  Akathisia:  No  Handed:  Right  AIMS (if indicated):  N/A  Assets:  Communication Skills Desire for Improvement  ADL's:  Intact  Cognition: WNL  Sleep:  poor   Assessment Janice Hampton is a 71 year old female with depression, anxiety, Parkinson's disease, Myasthenia gravis, hypothyroidism, cervical spondylosis who presents for follow up appointment for anxiety.   # GAD # Panic disorder Patient continues to endorse anxiety with fear of choking. Although it is difficult to discern in the setting of parkinson's disease, she does have fear of avoidance and her anxiety itself may be contributing to/or at least exacerbating her somatic symptoms. We discontinued duloxetine due to adverse reaction of insomnia and increased muscle tightness. Will try mirtazapine at lower dose to target her anxiety, insomnia and appetite loss. Discussed risk of oversedation and worsening spasticity given her neurological disease. She may try gabapentin in the future which would be beneficial for her anxiety/pain, if she is unable to tolerate mirtazapine. She tends to ruminate on cognitive distortion of catatrophizing; she will greatly benefit from CBT as well as supportive therapy for demoralization. She is scheduled to see a therapist.   Plan 1. Discontinue duloxetine 2. Start mirtazapine 7.5 mg at night 3. Continue clonazepam 0.5 twice a day as needed for anxiety 4. Continue quetiapine 12.5 mg at night  5. Return to clinic in one month for 30 mins  The patient demonstrates the  following risk factors for suicide: Chronic risk factors for suicide include: psychiatric disorder of anxiety and chronic pain. Acute risk factors for suicide include: unemployment. Protective factors for this patient include: positive social support, responsibility to others (children, family), coping skills and hope for the future. Considering these factors, the overall suicide risk at this point appears to be low. Patient is  appropriate for outpatient follow up.  Treatment Plan Summary: Plan as above  The duration of this appointment visit was 30 minutes of face-to-face time with the patient.  Greater than 50% of this time was spent in counseling, explanation of  diagnosis, planning of further management, and coordination of care  Norman Clay, MD 11/22/2016, 4:20 PM

## 2016-11-22 ENCOUNTER — Telehealth (HOSPITAL_COMMUNITY): Payer: Self-pay | Admitting: *Deleted

## 2016-11-22 ENCOUNTER — Encounter (HOSPITAL_COMMUNITY): Payer: Self-pay | Admitting: Psychiatry

## 2016-11-22 ENCOUNTER — Ambulatory Visit (INDEPENDENT_AMBULATORY_CARE_PROVIDER_SITE_OTHER): Payer: Medicare Other | Admitting: Psychiatry

## 2016-11-22 VITALS — BP 102/30 | HR 73 | Ht 64.0 in

## 2016-11-22 DIAGNOSIS — G20 Parkinson's disease: Secondary | ICD-10-CM | POA: Diagnosis not present

## 2016-11-22 DIAGNOSIS — G7000 Myasthenia gravis without (acute) exacerbation: Secondary | ICD-10-CM | POA: Diagnosis not present

## 2016-11-22 DIAGNOSIS — Z87891 Personal history of nicotine dependence: Secondary | ICD-10-CM

## 2016-11-22 DIAGNOSIS — M47892 Other spondylosis, cervical region: Secondary | ICD-10-CM

## 2016-11-22 DIAGNOSIS — Z79899 Other long term (current) drug therapy: Secondary | ICD-10-CM | POA: Diagnosis not present

## 2016-11-22 DIAGNOSIS — E039 Hypothyroidism, unspecified: Secondary | ICD-10-CM | POA: Diagnosis not present

## 2016-11-22 DIAGNOSIS — F329 Major depressive disorder, single episode, unspecified: Secondary | ICD-10-CM

## 2016-11-22 DIAGNOSIS — F411 Generalized anxiety disorder: Secondary | ICD-10-CM | POA: Diagnosis not present

## 2016-11-22 DIAGNOSIS — F410 Panic disorder [episodic paroxysmal anxiety] without agoraphobia: Secondary | ICD-10-CM | POA: Diagnosis not present

## 2016-11-22 MED ORDER — MIRTAZAPINE 15 MG PO TABS: 7.5000 mg | ORAL_TABLET | Freq: Every day | ORAL | 0 refills | Status: DC

## 2016-11-22 MED ORDER — CLONAZEPAM 0.5 MG PO TABS: 0.5000 mg | ORAL_TABLET | Freq: Two times a day (BID) | ORAL | 0 refills | Status: DC | PRN

## 2016-11-22 MED ORDER — MIRTAZAPINE 15 MG PO TABS: 7.5000 mg | ORAL_TABLET | Freq: Every day | ORAL | 1 refills | Status: DC

## 2016-11-22 NOTE — Telephone Encounter (Signed)
Done

## 2016-11-22 NOTE — Telephone Encounter (Signed)
noted 

## 2016-11-22 NOTE — Telephone Encounter (Signed)
Pt pharmacy requesting 90 days supply for pt Mirtazapine 15 mg QHS. Pt medication was filled today and only 15 tablets was dispensed. Pt pharmacy number [phone].

## 2016-11-22 NOTE — Patient Instructions (Signed)
1. Discontinue duloxetine 2. Start mirtazapine 7.5 mg at night 3. Continue clonazepam 0.5 twice a day as needed for anxiety 4. Continue quetiapine 12.5 mg at night  5. Return to clinic in one month for 30 mins

## 2016-11-27 ENCOUNTER — Telehealth (HOSPITAL_COMMUNITY): Payer: Self-pay | Admitting: *Deleted

## 2016-11-27 NOTE — Telephone Encounter (Signed)
phone call from patient, she said the new medicine, mirtazapine).  She said as soon as she takes the medicine her feet gets stiff, when she wakes up her mouth is very dry and she can't swallow.   She said she gets over it and it comes back again.  She said she don't think she can handle this.   She said her husband thinks it is more of the anxiety.  She said when she had guest at her house, her feet got really stiff, and she gets really anxious.

## 2016-11-28 ENCOUNTER — Other Ambulatory Visit (HOSPITAL_COMMUNITY): Payer: Self-pay | Admitting: Psychiatry

## 2016-11-28 MED ORDER — GABAPENTIN 100 MG PO CAPS: 100.0000 mg | ORAL_CAPSULE | Freq: Three times a day (TID) | ORAL | 0 refills | Status: DC

## 2016-11-28 NOTE — Telephone Encounter (Signed)
Discussed with patient. Patient complains of worsening in dry mouth and choking sensation. She also reports worsening muscle rigidity after starting mirtazapine. It comes like episodic though the day. Will discontinue mirtazapine. She and her husband is instructed to start gabapentin 100 mg at night up to 100 mg three times a day after her symptoms resolves.

## 2016-11-29 ENCOUNTER — Ambulatory Visit (INDEPENDENT_AMBULATORY_CARE_PROVIDER_SITE_OTHER): Payer: Medicare Other | Admitting: Clinical

## 2016-11-29 DIAGNOSIS — F064 Anxiety disorder due to known physiological condition: Secondary | ICD-10-CM

## 2016-12-03 ENCOUNTER — Ambulatory Visit (INDEPENDENT_AMBULATORY_CARE_PROVIDER_SITE_OTHER): Payer: 59 | Admitting: Clinical

## 2016-12-03 DIAGNOSIS — F064 Anxiety disorder due to known physiological condition: Secondary | ICD-10-CM | POA: Diagnosis not present

## 2016-12-10 ENCOUNTER — Encounter (HOSPITAL_BASED_OUTPATIENT_CLINIC_OR_DEPARTMENT_OTHER): Payer: Self-pay | Admitting: *Deleted

## 2016-12-10 ENCOUNTER — Emergency Department (HOSPITAL_BASED_OUTPATIENT_CLINIC_OR_DEPARTMENT_OTHER): Payer: 59

## 2016-12-10 ENCOUNTER — Emergency Department (HOSPITAL_BASED_OUTPATIENT_CLINIC_OR_DEPARTMENT_OTHER)
Admission: EM | Admit: 2016-12-10 | Discharge: 2016-12-10 | Disposition: A | Discharge status: Home / Self Care | Payer: 59 | Attending: Emergency Medicine | Admitting: Emergency Medicine

## 2016-12-10 DIAGNOSIS — G20 Parkinson's disease: Secondary | ICD-10-CM | POA: Diagnosis not present

## 2016-12-10 DIAGNOSIS — Z79899 Other long term (current) drug therapy: Secondary | ICD-10-CM | POA: Insufficient documentation

## 2016-12-10 DIAGNOSIS — E039 Hypothyroidism, unspecified: Secondary | ICD-10-CM | POA: Insufficient documentation

## 2016-12-10 DIAGNOSIS — Z87891 Personal history of nicotine dependence: Secondary | ICD-10-CM | POA: Insufficient documentation

## 2016-12-10 DIAGNOSIS — S43492A Other sprain of left shoulder joint, initial encounter: Secondary | ICD-10-CM | POA: Insufficient documentation

## 2016-12-10 DIAGNOSIS — Y9301 Activity, walking, marching and hiking: Secondary | ICD-10-CM | POA: Insufficient documentation

## 2016-12-10 DIAGNOSIS — Y999 Unspecified external cause status: Secondary | ICD-10-CM | POA: Diagnosis not present

## 2016-12-10 DIAGNOSIS — Y929 Unspecified place or not applicable: Secondary | ICD-10-CM | POA: Insufficient documentation

## 2016-12-10 DIAGNOSIS — W19XXXA Unspecified fall, initial encounter: Secondary | ICD-10-CM

## 2016-12-10 DIAGNOSIS — S4992XA Unspecified injury of left shoulder and upper arm, initial encounter: Secondary | ICD-10-CM | POA: Diagnosis present

## 2016-12-10 DIAGNOSIS — W0110XA Fall on same level from slipping, tripping and stumbling with subsequent striking against unspecified object, initial encounter: Secondary | ICD-10-CM | POA: Diagnosis not present

## 2016-12-10 MED ORDER — ACETAMINOPHEN 160 MG/5ML PO SOLN
650.0000 mg | Freq: Once | ORAL | Status: AC
  Administered 2016-12-10: 650 mg via ORAL
  Filled 2016-12-10: qty 20.3

## 2016-12-10 MED ORDER — ACETAMINOPHEN 325 MG PO TABS
650.0000 mg | ORAL_TABLET | Freq: Once | ORAL | Status: DC
  Filled 2016-12-10: qty 2

## 2016-12-10 NOTE — ED Notes (Signed)
Family verbalizes understanding of d/c instructions and denies any further needs at this time. 

## 2016-12-10 NOTE — ED Triage Notes (Signed)
Pt c/o fall x 30 mins ago x/o left shoulder pain

## 2016-12-10 NOTE — ED Notes (Signed)
Patient transported to X-ray 

## 2016-12-10 NOTE — ED Provider Notes (Signed)
[facility] Provider Note   CSN: [id number] Arrival date & time: 12/10/16  1912  By signing my name below, I, Vista Mink, attest that this documentation has been prepared under the direction and in the presence of Zadie Rhine, MD. Electronically signed, Vista Mink, ED Scribe. 12/10/16. 11:11 PM.   History   Chief Complaint Chief Complaint  Patient presents with  . Shoulder Injury    HPI HPI Comments: Janice Hampton is a 71 y.o. female, with Hx of Parkinson's, MG, who presents to the Emergency Department complaining of sudden onset left shoulder pain s/p a mechanical fall that occurred just prior to arrival. Pt's family was helping the pt ambulate and was holding onto her right arm. Pt states that she was wearing socks on hardwood floor which caused her to accidentally slip and fall. The husband lost grip of her and pt fell striking her left shoulder on the ground. No head injury, no LOC. No medications taken prior to arrival. Pt uses a walker to ambulate at home, she is able to bear weight on both extremities. She denies any shortness of breath. She does not take any blood thinners. No chest pain or abdominal pain. No pain in hip or lower extremities.  The history is provided by the patient. No language interpreter was used.    Past Medical History:  Diagnosis Date  . Acute cystitis without hematuria   . Anxiety   . Decubitus ulcer of coccygeal region, stage 2   . Dehydration with hyponatremia   . Depression   . Dysphagia   . Hypomagnesemia   . Hypothyroidism   . Myasthenia gravis (HCC)   . New onset atrial fibrillation (HCC)   . Pancytopenia (HCC) 05/28/2016  . Parkinson's disease (HCC)   . Syncope and collapse     Patient Active Problem List   Diagnosis Date Noted  . Generalized anxiety disorder 10/22/2016  . Panic disorder 10/22/2016  . Generalized weakness 06/07/2016    History reviewed. No pertinent surgical history.  OB History    No data  available       Home Medications    Prior to Admission medications   Medication Sig Start Date End Date Taking? Authorizing Provider  acetaminophen (TYLENOL) 325 MG tablet Take 650 mg by mouth 2 (two) times daily as needed.    [provider]  baclofen (LIORESAL) 10 MG tablet Take 10 mg by mouth 4 (four) times daily. Give at 10A and 10P 06/13/16   [provider]  calcium-vitamin D (OSCAL WITH D) 500-200 MG-UNIT tablet Take 1 tablet by mouth daily with breakfast.    [provider]  carbidopa-levodopa (SINEMET CR) 50-200 MG tablet Take 1 tablet by mouth at bedtime.    [provider]  carbidopa-levodopa (SINEMET IR) 25-100 MG tablet Take by mouth. At 6 AM taking 2 tablets, at 10 am taking 2.5 tablets, At 2 pm taking 2 Tablets, At 6 pm taking 2 tablets, Around 2am-4am PRN Taking 1 Tablet.    [provider]  Cholecalciferol (VITAMIN D3 PO) Take 1,000 mg by mouth daily.    [provider]  clonazePAM (KLONOPIN) 0.5 MG tablet Take 1 tablet (0.5 mg total) by mouth 2 (two) times daily as needed for anxiety. 11/22/16   Neysa Hotter, MD  CRANBERRY CONCENTRATE PO Take 800 mg by mouth daily.     [provider]  gabapentin (NEURONTIN) 100 MG capsule Take 1 capsule (100 mg total) by mouth 3 (three) times daily.  11/28/16   Neysa HotterHisada, Reina, MD  levothyroxine (SYNTHROID, LEVOTHROID) 100 MCG tablet Take 100 mcg by mouth daily before breakfast.    [provider]  Menthol (ICY HOT) 5 % PTCH Apply 1 application topically daily. Apply to left scapula at 6AM, remove at [facility]6PM    [provider]  metoprolol succinate (TOPROL-XL) 25 MG 24 hr tablet Take 25 mg by mouth daily.    [provider]  Multiple Vitamins-Minerals (CENTRUM SILVER 50+WOMEN PO) Take by mouth daily.    [provider]  polyethylene glycol (MIRALAX / GLYCOLAX) packet Take 17 g by mouth. Drinking one Glass in AM and PRN in PM    [provider]    pramipexole (MIRAPEX) 1.5 MG tablet Take 1.5 mg by mouth. Taking 0.5 tablet in AM and 1 Tablet at noon    [provider]  predniSONE (DELTASONE) 5 MG tablet Take 5 mg by mouth 2 (two) times daily with a meal.     [provider]  QUEtiapine (SEROQUEL) 25 MG tablet Take 12.5 mg by mouth at bedtime.    [provider]  sennosides-docusate sodium (SENOKOT-S) 8.6-50 MG tablet Take 2 tablets by mouth 2 (two) times daily.     [provider]  traMADol (ULTRAM) 50 MG tablet Take 50 mg by mouth every 6 (six) hours as needed for moderate pain.    [provider]    Family History History reviewed. No pertinent family history.  Social History Social History  Substance Use Topics  . Smoking status: Former Smoker    Types: Cigarettes    Quit date: 05/07/1977  . Smokeless tobacco: Never Used  . Alcohol use No    Allergies   Patient has no known allergies.   Review of Systems Review of Systems  HENT: Negative for facial swelling (no head injury).   Respiratory: Negative for shortness of breath.   Cardiovascular: Negative for chest pain.  Gastrointestinal: Negative for abdominal pain.  Musculoskeletal: Positive for arthralgias (left shoulder). Negative for back pain and gait problem (no pain).  Skin: Negative for wound.  Neurological: Negative for syncope and weakness.  All other systems reviewed and are negative.    Physical Exam Updated Vital Signs BP 108/68 (BP Location: Right Arm)   Pulse 84   Temp 98 F (36.7 C) (Oral)   Resp 18   SpO2 95%   Physical Exam CONSTITUTIONAL: Elderly and frail HEAD: Normocephalic/atraumatic EYES: EOMI/PERRL ENMT: Mucous membranes moist NECK: supple no meningeal signs SPINE/BACK:entire spine nontender. Kyphotic spine, No bruising/crepitance/stepoffs noted to spine CV: S1/S2 noted, no murmurs/rubs/gallops noted LUNGS: Lungs are clear to auscultation bilaterally, no apparent distress ABDOMEN: soft,  nontender, no rebound or guarding, bowel sounds noted throughout abdomen GU:no cva tenderness NEURO: Pt is awake/alert/appropriate, moves all extremitiesx4.  No facial droop.   EXTREMITIES: pulses normal/equal, full ROM. Mild tenderness to left clavicle, no deformities. Full ROM of left shoulder, All other extremities/joints palpated/ranged and nontender SKIN: warm, color normal   ED Treatments / Results  DIAGNOSTIC STUDIES: Oxygen Saturation is 95% on RA, normal by my interpretation.  COORDINATION OF CARE: 11:08 PM-Will order medication for pain. Discussed treatment plan with pt at bedside and pt agreed to plan.   Labs (all labs ordered are listed, but only abnormal results are displayed) Labs Reviewed - No data to display  EKG  EKG Interpretation None       Radiology Dg Clavicle Left  Result Date: 12/10/2016 CLINICAL DATA:  Fall with shoulder pain  EXAM: LEFT CLAVICLE - 2+ VIEWS COMPARISON:  None. FINDINGS: There is no evidence of fracture or other focal bone lesions. Soft tissues are unremarkable. Minimal acromioclavicular osteoarthrosis. IMPRESSION: No fracture or dislocation of the left clavicle. Electronically Signed   By: Deatra Robinson M.D.   On: 12/10/2016 20:03   Dg Shoulder Left  Result Date: 12/10/2016 CLINICAL DATA:  Fall with shoulder pain EXAM: LEFT SHOULDER - 2+ VIEW COMPARISON:  None. FINDINGS: The bones are osteopenic. No fracture or dislocation. Mild glenohumeral osteoarthrosis. IMPRESSION: No fracture or dislocation of the left shoulder. Electronically Signed   By: Deatra Robinson M.D.   On: 12/10/2016 20:02    Procedures Procedures (including critical care time)  Medications Ordered in ED Medications  acetaminophen (TYLENOL) tablet 650 mg (650 mg Oral Not Given 12/10/16 2327)  acetaminophen (TYLENOL) solution 650 mg (650 mg Oral Given 12/10/16 2332)     Initial Impression / Assessment and Plan / ED Course  I have reviewed the triage vital signs and the  nursing notes.  Pertinent imaging results that were available during my care of the patient were reviewed by me and considered in my medical decision making (see chart for details).     Pt in the ED s/p mechanical fall Awake/alert No acute traumatic injury She has significant debility at baseline due to Parkinson/MG However, family confirms she is at baseline, has walker at home and has home OT/PT Husband reports he can take her home Pt able to stand without any pain Will d/c home  Final Clinical Impressions(s) / ED Diagnoses   Final diagnoses:  Fall, initial encounter  Sprain of other part of left shoulder region, initial encounter    New Prescriptions New Prescriptions   No medications on file  I personally performed the services described in this documentation, which was scribed in my presence. The recorded information has been reviewed and is accurate.       Zadie Rhine, MD 12/11/16 [phone]

## 2016-12-11 ENCOUNTER — Ambulatory Visit (INDEPENDENT_AMBULATORY_CARE_PROVIDER_SITE_OTHER): Payer: 59 | Admitting: Clinical

## 2016-12-11 DIAGNOSIS — F064 Anxiety disorder due to known physiological condition: Secondary | ICD-10-CM

## 2016-12-12 NOTE — Progress Notes (Deleted)
[place] MD/PA/NP OP Progress Note  12/12/2016 11:00 AM Janice Hampton  MRN:  [id number]  Chief Complaint:   Subjective:  "I'm scared" HPI:  - Patient had a mechanical fall since the last encounter - Patient complained of worsening muscle rigidity, dry mouth and choking sensation after starting mirtazapine. It was switched to gabapentin 100 mg at night up to 100 mg three times a day - Per chart review, patient was admitted, recommended by her neurologist wit concern for MG flare up given worsening head drop, dysphagia, and mild dyspnea. She was evaluated by PT/ST with no concern for worsening in her function.       Patient presents for follow up appointment. She states that she continues to have sensation of closing of her throat in the morning, and feels it has been getting worse. After discussion with her husband who reports less of her symptoms since having additional dose of sinemet, she agrees that she might be feeling more overwhelmed than what he actually sees. She feels frustration of not being able to go outside or having dinner with her friends due to fear of "episode." Although she has been walking at home, it has been getting more difficult due to her fear.   She reports insomnia. She has increased appetite, although her actual meal intake tend to be less than before. She enjoys doing Sudoku. She feels fatigue. She denies SI, HI, AH/VH. She complained dry mouth when she used to be on duloxetine.   Janice Hampton, her husband presents to the interview.  He believes that she appears to do better after having additional Sinemet. He is not aware that she has insomnia. He did not see any change since discontinuing morning dose of baclofen.   Chart reviewed in care everywhere.   She was diagnosed with Parkinson's disease in 2013 and myasthenia gravis in 2014. 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 She was  hospitalized in Dec 2017 with severe cytopenias and had a bone marrow biopsy, which showed hypocellular bone marrow, with minimal  dyspoiesis and normal cytogenetics. It was felt that the cause of pancytopenia was Imuran, which was discontinued. Her Sinemet dose was lowered during the admission, which has improved her appetite, nausea, sleepiness, lightheadedness. She has been having night time awakening due to akinesia, adding extra Sinemet tab at 3am to chew if she wakens has helped her get back to sleep.  Per note written by Dr. Mickie Bail, Janice Hampton, neurologist "Despite these improvements, she still feels she is doing poorly. She admits to primarily behavioral wearing off during the day, however she has deep motor off upon waking up at 6am. She has significant anxiety, which limits her independent walking. On exam today, I felt her gait was slightly improved and was mostly struck by the severe anxiety that developed upon standing. She began to report light-headedness BEFORE she stood up, just at the thought of standing. 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 I checked her BP sitting and standing, there was no change."   She was started on quetiapine for hallucinations. She use to be on sertraline, which appeared to be switched to Paxil by her PCP, which patient discontinued due to feeling that her throat to close up.   Visit Diagnosis:  No diagnosis found.  Past Psychiatric History:  Outpatient: denies Psychiatry admission: denies  Previous suicide attempt: denies Past trials of medication: paroxetine (dry mouth), duloxetine (worsening muscle rigidity), mirtazapine (worsening muscle rigidity), clonazepam History  of violence: denies  Past Medical History:  Past Medical History:  Diagnosis Date  . Acute cystitis without hematuria   . Anxiety   . Decubitus ulcer of coccygeal region, stage 2   . Dehydration with hyponatremia   . Depression   . Dysphagia   . Hypomagnesemia   . Hypothyroidism   .  Myasthenia gravis ([place])   . New onset atrial fibrillation ([place])   . Pancytopenia ([place]) 05/28/2016  . Parkinson's disease ([place])   . Syncope and collapse    No past surgical history on file.  Family Psychiatric History:  denies  Family History: No family history on file.  Social History:  Social History   Social History  . Marital status: Married    Spouse name: N/A  . Number of children: N/A  . Years of education: N/A   Social History Main Topics  . Smoking status: Former Smoker    Types: Cigarettes    Quit date: 05/07/1977  . Smokeless tobacco: Never Used  . Alcohol use No  . Drug use: No  . Sexual activity: Not Currently   Other Topics Concern  . Not on file   Social History Narrative  . No narrative on file   Work: used to work for [employer], retired at age 83 She is originally from [place],  moved to [place] 17 years ago 54 o her husband job.  She is married for 42 years and has two children.  Education: graduated from [employer]  Allergies: No Known Allergies  Metabolic Disorder Labs: No results found for: HGBA1C, MPG No results found for: PROLACTIN No results found for: CHOL, TRIG, HDL, CHOLHDL, VLDL, LDLCALC   Current Medications: Current Outpatient Prescriptions  Medication Sig Dispense Refill  . acetaminophen (TYLENOL) 325 MG tablet Take 650 mg by mouth 2 (two) times daily as needed.    . baclofen (LIORESAL) 10 MG tablet Take 10 mg by mouth 4 (four) times daily. Give at 10A and 10P    . calcium-vitamin D (OSCAL WITH D) 500-200 MG-UNIT tablet Take 1 tablet by mouth daily with breakfast.    . carbidopa-levodopa (SINEMET CR) 50-200 MG tablet Take 1 tablet by mouth at bedtime.    . carbidopa-levodopa (SINEMET IR) 25-100 MG tablet Take by mouth. At 6 AM taking 2 tablets, at 10 am taking 2.5 tablets, At 2 pm taking 2 Tablets, At 6 pm taking 2 tablets, Around 2am-4am PRN Taking 1 Tablet.    . Cholecalciferol (VITAMIN D3 PO) Take 1,000 mg by mouth daily.    .  clonazePAM (KLONOPIN) 0.5 MG tablet Take 1 tablet (0.5 mg total) by mouth 2 (two) times daily as needed for anxiety. 60 tablet 0  . CRANBERRY CONCENTRATE PO Take 800 mg by mouth daily.     Marland Kitchen gabapentin (NEURONTIN) 100 MG capsule Take 1 capsule (100 mg total) by mouth 3 (three) times daily. 90 capsule 0  . levothyroxine (SYNTHROID, LEVOTHROID) 100 MCG tablet Take 100 mcg by mouth daily before breakfast.    . Menthol (ICY HOT) 5 % PTCH Apply 1 application topically daily. Apply to left scapula at 6AM, remove at [facility]    . metoprolol succinate (TOPROL-XL) 25 MG 24 hr tablet Take 25 mg by mouth daily.    . Multiple Vitamins-Minerals (CENTRUM SILVER 50+WOMEN PO) Take by mouth daily.    . polyethylene glycol (MIRALAX / GLYCOLAX) packet Take 17 g by mouth. Drinking one Glass in AM and PRN in PM    . pramipexole (MIRAPEX) 1.5  MG tablet Take 1.5 mg by mouth. Taking 0.5 tablet in AM and 1 Tablet at noon    . predniSONE (DELTASONE) 5 MG tablet Take 5 mg by mouth 2 (two) times daily with a meal.     . QUEtiapine (SEROQUEL) 25 MG tablet Take 12.5 mg by mouth at bedtime.    . sennosides-docusate sodium (SENOKOT-S) 8.6-50 MG tablet Take 2 tablets by mouth 2 (two) times daily.     . traMADol (ULTRAM) 50 MG tablet Take 50 mg by mouth every 6 (six) hours as needed for moderate pain.     No current facility-administered medications for this visit.     Neurologic: Headache: No Seizure: No Paresthesias: No  Musculoskeletal: Strength & Muscle Tone: decreased Gait & Station: on wheelchair Patient leans: N/A  Psychiatric Specialty Exam: Review of Systems  Musculoskeletal: Positive for myalgias and neck pain.  Psychiatric/Behavioral: Negative for depression, hallucinations, substance abuse and suicidal ideas. The patient is nervous/anxious and has insomnia.   All other systems reviewed and are negative.   There were no vitals taken for this visit.There is no height or weight on file to calculate BMI.  General  Appearance: Fairly Groomed  Eye Contact:  Good  Speech:  Clear and Coherent  Volume:  Normal  Mood:  Anxious  Affect:  Congruent, Full Range and anxious, tearful at times  Thought Process:  Coherent and Goal Directed  Orientation:  Full (Time, Place, and Person)  Thought Content: Logical Perceptions: denies AH/VH  Suicidal Thoughts:  No  Homicidal Thoughts:  No  Memory:  Immediate;   Good Recent;   Good Remote;   Good  Judgement:  Good  Insight:  Fair  Psychomotor Activity:  Normal  Concentration:  Concentration: Good and Attention Span: Good  Recall:  Good  Fund of Knowledge: Good  Language: Good  Akathisia:  No  Handed:  Right  AIMS (if indicated):  N/A  Assets:  Communication Skills Desire for Improvement  ADL's:  Intact  Cognition: WNL  Sleep:  poor   Assessment CECELIA GRACIANO is a 71 year old female with depression, anxiety, Parkinson's disease, Myasthenia gravis, hypothyroidism, cervical spondylosis who presents for follow up appointment for anxiety.   # GAD # Panic disorder Patient continues to endorse anxiety with fear of choking. Although it is difficult to discern in the setting of parkinson's disease, she does have fear of avoidance and her anxiety itself may be contributing to/or at least exacerbating her somatic symptoms. We discontinued duloxetine due to adverse reaction of insomnia and increased muscle tightness. Will try mirtazapine at lower dose to target her anxiety, insomnia and appetite loss. Discussed risk of oversedation and worsening spasticity given her neurological disease. She may try gabapentin in the future which would be beneficial for her anxiety/pain, if she is unable to tolerate mirtazapine. She tends to ruminate on cognitive distortion of catatrophizing; she will greatly benefit from CBT as well as supportive therapy for demoralization. She is scheduled to see a therapist.   Plan 1. Discontinue duloxetine 2. Start mirtazapine 7.5 mg at night 3.  Continue clonazepam 0.5 twice a day as needed for anxiety 4. Continue quetiapine 12.5 mg at night  5. Return to clinic in one month for 30 mins  The patient demonstrates the following risk factors for suicide: Chronic risk factors for suicide include: psychiatric disorder of anxiety and chronic pain. Acute risk factors for suicide include: unemployment. Protective factors for this patient include: positive social support, responsibility to others (children,  family), coping skills and hope for the future. Considering these factors, the overall suicide risk at this point appears to be low. Patient is appropriate for outpatient follow up.  Treatment Plan Summary: Plan as above  The duration of this appointment visit was 30 minutes of face-to-face time with the patient.  Greater than 50% of this time was spent in counseling, explanation of  diagnosis, planning of further management, and coordination of care  Janice Clay, MD 12/12/2016, 11:00 AM

## 2016-12-17 NOTE — Progress Notes (Signed)
BH MD/PA/NP OP Progress Note  12/18/2016 2:36 PM Janice Hampton  MRN:  [id number]  Chief Complaint:  Chief Complaint    Anxiety; Follow-up     Subjective:  "I don't feel good." HPI:  - Patient had a mechanical fall since the last encounter - Patient complained of worsening muscle rigidity, dry mouth and choking sensation after starting mirtazapine. It was switched to gabapentin 100 mg at night up to 100 mg three times a day - Per chart review, patient was admitted, recommended by her neurologist wit concern for MG flare up given worsening head drop, dysphagia, and mild dyspnea. She was evaluated by PT/ST with no concern for worsening in her function.   Patient presents for follow up appointment for anxiety. She states that she doesn't feel good. She is scared about eating as she has choking sensation. She tends to dwell on "everything bad" and it is difficult to distract herself. She enjoys going shopping or meeting with her grandchildren. She endorses insomnia. She feels down at times. She denies SI, HI, AH/VH. She takes clonazepam twice per day for significant anxiety. She complains of dry mouth since starting gabapentin.   Her husband present to the interview.  She appears to be more anxious during the encounter. She usually has similar episode of anxiety with choking sensation three times a day, especially worse in the morning. She is able to enjoy playing cards or going shopping on other time. PT/ST is visiting her house and helps her to eat meals.   Visit Diagnosis:    ICD-10-CM   1. Generalized anxiety disorder F41.1   2. Panic disorder F41.0     Past Psychiatric History:  I have reviewed the patient's psychiatry history in detail and updated the patient record.  Outpatient: denies Psychiatry admission: denies Previous suicide attempt: denies Past trials of medication: paroxetine (dry mouth), duloxetine (insomnia and increased muscle tightness), mirtazapine (rigidity, choking  sensation), clonazepam History of violence: denies  Past Medical History:  Past Medical History:  Diagnosis Date  . Acute cystitis without hematuria   . Anxiety   . Decubitus ulcer of coccygeal region, stage 2   . Dehydration with hyponatremia   . Depression   . Dysphagia   . Hypomagnesemia   . Hypothyroidism   . Myasthenia gravis (HCC)   . New onset atrial fibrillation (HCC)   . Pancytopenia (HCC) 05/28/2016  . Parkinson's disease (HCC)   . Syncope and collapse    No past surgical history on file.  Family Psychiatric History:  Denies  Family History: No family history on file.  Social History:  Social History   Social History  . Marital status: Married    Spouse name: N/A  . Number of children: N/A  . Years of education: N/A   Social History Main Topics  . Smoking status: Former Smoker    Types: Cigarettes    Quit date: 05/07/1977  . Smokeless tobacco: Never Used  . Alcohol use No  . Drug use: No  . Sexual activity: Not Currently   Other Topics Concern  . None   Social History Narrative  . None    Allergies: No Known Allergies  Metabolic Disorder Labs: No results found for: HGBA1C, MPG No results found for: PROLACTIN No results found for: CHOL, TRIG, HDL, CHOLHDL, VLDL, LDLCALC   Current Medications: Current Outpatient Prescriptions  Medication Sig Dispense Refill  . acetaminophen (TYLENOL) 325 MG tablet Take 650 mg by mouth 2 (two) times daily as needed.    Marland Kitchen  baclofen (LIORESAL) 10 MG tablet Take 10 mg by mouth 4 (four) times daily. Give at 10A and 10P    . calcium-vitamin D (OSCAL WITH D) 500-200 MG-UNIT tablet Take 1 tablet by mouth daily with breakfast.    . carbidopa-levodopa (SINEMET CR) 50-200 MG tablet Take 1 tablet by mouth at bedtime.    . carbidopa-levodopa (SINEMET IR) 25-100 MG tablet Take by mouth. At 6 AM taking 2 tablets, at 10 am taking 2.5 tablets, At 2 pm taking 2 Tablets, At 6 pm taking 2 tablets, Around 2am-4am PRN Taking 1  Tablet.    . Cholecalciferol (VITAMIN D3 PO) Take 1,000 mg by mouth daily.    . clonazePAM (KLONOPIN) 0.5 MG tablet Take 1 tablet (0.5 mg total) by mouth 2 (two) times daily as needed for anxiety. 60 tablet 0  . CRANBERRY CONCENTRATE PO Take 800 mg by mouth daily.     Marland Kitchen gabapentin (NEURONTIN) 100 MG capsule Take 1 capsule (100 mg total) by mouth 2 (two) times daily. 100 mg twice a day and 300 mg at night 60 capsule 1  . levothyroxine (SYNTHROID, LEVOTHROID) 100 MCG tablet Take 100 mcg by mouth daily before breakfast.    . Menthol (ICY HOT) 5 % PTCH Apply 1 application topically daily. Apply to left scapula at 6AM, remove at [facility]    . metoprolol succinate (TOPROL-XL) 25 MG 24 hr tablet Take 25 mg by mouth daily.    . Multiple Vitamins-Minerals (CENTRUM SILVER 50+WOMEN PO) Take by mouth daily.    . polyethylene glycol (MIRALAX / GLYCOLAX) packet Take 17 g by mouth. Drinking one Glass in AM and PRN in PM    . pramipexole (MIRAPEX) 1.5 MG tablet Take 1.5 mg by mouth. Taking 0.5 tablet in AM and 1 Tablet at noon    . predniSONE (DELTASONE) 5 MG tablet Take 5 mg by mouth 2 (two) times daily with a meal.     . QUEtiapine (SEROQUEL) 25 MG tablet Take 12.5 mg by mouth at bedtime.    . sennosides-docusate sodium (SENOKOT-S) 8.6-50 MG tablet Take 2 tablets by mouth 2 (two) times daily.     . traMADol (ULTRAM) 50 MG tablet Take 50 mg by mouth every 6 (six) hours as needed for moderate pain.    Marland Kitchen gabapentin (NEURONTIN) 300 MG capsule Take 1 capsule (300 mg total) by mouth at bedtime. 100 mg twice a day and 300 mg at night 30 capsule 1   No current facility-administered medications for this visit.      Neurologic: Headache: No Seizure: No Paresthesias: No  Musculoskeletal: Strength & Muscle Tone: within normal limits Gait & Station: normal Patient leans: N/A  Psychiatric Specialty Exam: Review of Systems  Psychiatric/Behavioral: Positive for depression. Negative for hallucinations, substance abuse  and suicidal ideas. The patient is nervous/anxious and has insomnia.   All other systems reviewed and are negative.   Blood pressure 138/84, pulse 78, height 5\' 4"  (1.626 m), weight 145 lb (65.8 kg).Body mass index is 24.89 kg/m.  General Appearance: Fairly Groomed  Eye Contact:  Good  Speech:  Clear and Coherent  Volume:  Normal  Mood:  Anxious  Affect:  anxious  Thought Process:  Coherent and Goal Directed  Orientation:  Full (Time, Place, and Person)  Thought Content: Rumination Perceptions: denies AH/VH  Suicidal Thoughts:  No  Homicidal Thoughts:  No  Memory:  Immediate;   Good Recent;   Good Remote;   Good  Judgement:  Good  Insight:  Fair  Psychomotor Activity:  Normal  Concentration:  Concentration: Good and Attention Span: Good  Recall:  Good  Fund of Knowledge: Good  Language: Good  Akathisia:  No  Handed:  Right  AIMS (if indicated):  No rigidity, no tremors  Assets:  Communication Skills Desire for Improvement  ADL's:  Intact  Cognition: WNL  Sleep:  poor   Assessment Janice Hampton is a 71 year old female with depression, anxiety, Parkinson's disease, Myasthenia gravis, hypothyroidism, cervical spondylosis. Patient presents for follow up appointment for Generalized anxiety disorder  Panic disorder  # GAD # Panic disorder Exam is notable for ego dystonic rumination on dysphagia, although chart review indicates no organic cause of this symptom. Will uptitrate gabapentin to target anxiety; chose this medication given she did have significant side effect from Paxil, duloxetine and mirtazapine. Discussed behavioral activation. Spent time on cognitive restructuring and to prevent fear of avoidance. She will greatly benefit from CBT;  she is encouraged to continue to see her therapist.   Plan 1. Increase gabapentin 100 mg twice a day and 300 mg at night 2. Continue clonazepam 0.5 mg twice a day as needed for anxiety 3. Continue quetiapine 12.5 mg at night 4.  Return to clinic in one month for 30 mins  The patient demonstrates the following risk factors for suicide: Chronic risk factors for suicide include: psychiatric disorder of anxietyand chronic pain. Acute risk factorsfor suicide include: unemployment. Protective factorsfor this patient include: positive social support, responsibility to others (children, family), coping skills and hope for the future. Considering these factors, the overall suicide risk at this point appears to be low. Patient isappropriate for outpatient follow up.  Treatment Plan Summary:Plan as above  The duration of this appointment visit was 30 minutes of face-to-face time with the patient.  Greater than 50% of this time was spent in counseling, explanation of  diagnosis, planning of further management, and coordination of care.  Neysa Hottereina Yazlyn Wentzel, MD 12/18/2016, 2:36 PM

## 2016-12-18 ENCOUNTER — Ambulatory Visit (INDEPENDENT_AMBULATORY_CARE_PROVIDER_SITE_OTHER): Payer: 59 | Admitting: Psychiatry

## 2016-12-18 ENCOUNTER — Encounter (HOSPITAL_COMMUNITY): Payer: Self-pay | Admitting: Psychiatry

## 2016-12-18 VITALS — BP 138/84 | HR 78 | Ht 64.0 in | Wt 145.0 lb

## 2016-12-18 DIAGNOSIS — G4700 Insomnia, unspecified: Secondary | ICD-10-CM

## 2016-12-18 DIAGNOSIS — F410 Panic disorder [episodic paroxysmal anxiety] without agoraphobia: Secondary | ICD-10-CM | POA: Diagnosis not present

## 2016-12-18 DIAGNOSIS — M47892 Other spondylosis, cervical region: Secondary | ICD-10-CM | POA: Diagnosis not present

## 2016-12-18 DIAGNOSIS — E039 Hypothyroidism, unspecified: Secondary | ICD-10-CM | POA: Diagnosis not present

## 2016-12-18 DIAGNOSIS — F411 Generalized anxiety disorder: Secondary | ICD-10-CM | POA: Diagnosis not present

## 2016-12-18 DIAGNOSIS — G7000 Myasthenia gravis without (acute) exacerbation: Secondary | ICD-10-CM | POA: Diagnosis not present

## 2016-12-18 DIAGNOSIS — Z87891 Personal history of nicotine dependence: Secondary | ICD-10-CM | POA: Diagnosis not present

## 2016-12-18 DIAGNOSIS — G20 Parkinson's disease: Secondary | ICD-10-CM

## 2016-12-18 MED ORDER — CLONAZEPAM 0.5 MG PO TABS: 0.5000 mg | ORAL_TABLET | Freq: Two times a day (BID) | ORAL | 0 refills | Status: DC | PRN

## 2016-12-18 MED ORDER — GABAPENTIN 100 MG PO CAPS: 100.0000 mg | ORAL_CAPSULE | Freq: Two times a day (BID) | ORAL | 1 refills | Status: DC

## 2016-12-18 MED ORDER — GABAPENTIN 300 MG PO CAPS: 300.0000 mg | ORAL_CAPSULE | Freq: Every day | ORAL | 1 refills | Status: DC

## 2016-12-18 NOTE — Patient Instructions (Signed)
1. Increase gabapentin 100 mg twice a day and 300 mg at night 2. Continue clonazepam 0.5 mg twice a day as needed for anxiety 3. Continue quetiapine 12.5 mg at night 4. Return to clinic in one month for 30 mins

## 2016-12-24 ENCOUNTER — Ambulatory Visit (INDEPENDENT_AMBULATORY_CARE_PROVIDER_SITE_OTHER): Payer: 59 | Admitting: Clinical

## 2016-12-24 DIAGNOSIS — F064 Anxiety disorder due to known physiological condition: Secondary | ICD-10-CM | POA: Diagnosis not present

## 2017-01-03 ENCOUNTER — Ambulatory Visit (INDEPENDENT_AMBULATORY_CARE_PROVIDER_SITE_OTHER): Payer: 59 | Admitting: Clinical

## 2017-01-03 DIAGNOSIS — F064 Anxiety disorder due to known physiological condition: Secondary | ICD-10-CM

## 2017-01-14 NOTE — Progress Notes (Signed)
BH MD/PA/NP OP Progress Note  01/17/2017 1:52 PM Janice Hampton  MRN:  [id number]  Chief Complaint:  Chief Complaint    Follow-up; Anxiety     Subjective:  "I am scared of choking" HPI:  - Reviewed chart from care everywhere. She was seen at [facility]; prednisone was tapered down. She is followed at [facility] for parkinson's; instructed to take clonazepam at night.  Patient presents for follow up appointment for anxiety. She states that she has not been able to eat as she is scared of choking, although she feels hungry. She states that "somebody is telling me not to eat," although she denies any Ah/VH. She tends to ruminate on this and has lost weight. She also wonders if she lost her weight as she started to ride a bike with OT at home. She eats some big meal per day, stating that she ate a meat loaf (her husband corrected that she ate it only once, and usually has very few bites). She does not have issues with drinking a water. She denies panic attacks. She sleeps better after taking clonazepam at night. She denies ideas of reference or other paranoia.   Her husband states that there has been no change in her anxiety. She is anxious about choking and has very poor po intake.   Wt Readings from Last 3 Encounters:  01/17/17 120 lb (54.4 kg)  12/18/16 145 lb (65.8 kg)  06/22/16 145 lb (65.8 kg)    Per [facility] Clonazepam 60 tabs, refilled on 12/21/2016  Visit Diagnosis:    ICD-10-CM   1. Generalized anxiety disorder F41.1   2. Panic disorder F41.0     Past Psychiatric History:  I have reviewed the patient's psychiatry history in detail and updated the patient record. Outpatient: denies Psychiatry admission: denies Previous suicide attempt: denies Past trials of medication: paroxetine (dry mouth), duloxetine (insomnia and increased muscle tightness), mirtazapine (rigidity, choking sensation), clonazepam History of violence: denies  Past Medical History:  Past Medical  History:  Diagnosis Date  . Acute cystitis without hematuria   . Anxiety   . Decubitus ulcer of coccygeal region, stage 2   . Dehydration with hyponatremia   . Depression   . Dysphagia   . Hypomagnesemia   . Hypothyroidism   . Myasthenia gravis (HCC)   . New onset atrial fibrillation (HCC)   . Pancytopenia (HCC) 05/28/2016  . Parkinson's disease (HCC)   . Syncope and collapse    No past surgical history on file.  Family Psychiatric History:  I have reviewed the patient's family history in detail and updated the patient record. denies  Family History: No family history on file.  Social History:  Social History   Social History  . Marital status: Married    Spouse name: N/A  . Number of children: N/A  . Years of education: N/A   Social History Main Topics  . Smoking status: Former Smoker    Types: Cigarettes    Quit date: 05/07/1977  . Smokeless tobacco: Never Used  . Alcohol use No  . Drug use: No  . Sexual activity: Not Currently   Other Topics Concern  . None   Social History Narrative  . None    Allergies: No Known Allergies  Metabolic Disorder Labs: No results found for: HGBA1C, MPG No results found for: PROLACTIN No results found for: CHOL, TRIG, HDL, CHOLHDL, VLDL, LDLCALC   Current Medications: Current Outpatient Prescriptions  Medication Sig Dispense Refill  .  acetaminophen (TYLENOL) 325 MG tablet Take 650 mg by mouth 2 (two) times daily as needed.    . baclofen (LIORESAL) 10 MG tablet Take 10 mg by mouth 4 (four) times daily. Give at 10A and 10P    . calcium-vitamin D (OSCAL WITH D) 500-200 MG-UNIT tablet Take 1 tablet by mouth daily with breakfast.    . carbidopa-levodopa (SINEMET CR) 50-200 MG tablet Take 1 tablet by mouth at bedtime.    . carbidopa-levodopa (SINEMET IR) 25-100 MG tablet Take by mouth. At 6 AM taking 2 tablets, at 10 am taking 2.5 tablets, At 2 pm taking 2 Tablets, At 6 pm taking 2 tablets, Around 2am-4am PRN Taking 1 Tablet.     . Cholecalciferol (VITAMIN D3 PO) Take 1,000 mg by mouth daily.    . clonazePAM (KLONOPIN) 0.5 MG tablet Take 1 tablet (0.5 mg total) by mouth 2 (two) times daily as needed for anxiety. 60 tablet 0  . CRANBERRY CONCENTRATE PO Take 800 mg by mouth daily.     Marland Kitchen. gabapentin (NEURONTIN) 100 MG capsule Take 1 capsule (100 mg total) by mouth 2 (two) times daily. 100 mg twice a day and 300 mg at night 60 capsule 1  . gabapentin (NEURONTIN) 300 MG capsule Take 1 capsule (300 mg total) by mouth at bedtime. 100 mg twice a day and 300 mg at night 30 capsule 1  . levothyroxine (SYNTHROID, LEVOTHROID) 100 MCG tablet Take 100 mcg by mouth daily before breakfast.    . Menthol (ICY HOT) 5 % PTCH Apply 1 application topically daily. Apply to left scapula at 6AM, remove at [facility]6PM    . metoprolol succinate (TOPROL-XL) 25 MG 24 hr tablet Take 25 mg by mouth daily.    . Multiple Vitamins-Minerals (CENTRUM SILVER 50+WOMEN PO) Take by mouth daily.    . polyethylene glycol (MIRALAX / GLYCOLAX) packet Take 17 g by mouth. Drinking one Glass in AM and PRN in PM    . pramipexole (MIRAPEX) 1.5 MG tablet Take 1.5 mg by mouth. Taking 0.5 tablet in AM and 1 Tablet at noon    . predniSONE (DELTASONE) 5 MG tablet Take 5 mg by mouth 2 (two) times daily with a meal.     . QUEtiapine (SEROQUEL) 25 MG tablet Take 1 tablet (25 mg total) by mouth at bedtime. 30 tablet 1  . sennosides-docusate sodium (SENOKOT-S) 8.6-50 MG tablet Take 2 tablets by mouth 2 (two) times daily.     . traMADol (ULTRAM) 50 MG tablet Take 50 mg by mouth every 6 (six) hours as needed for moderate pain.     No current facility-administered medications for this visit.     Neurologic: Headache: No Seizure: No Paresthesias: No  Musculoskeletal: Strength & Muscle Tone: decreased Gait & Station: in a wheelchair Patient leans: N/A  Psychiatric Specialty Exam: Review of Systems  Psychiatric/Behavioral: Negative for depression, hallucinations, substance abuse  and suicidal ideas. The patient is nervous/anxious and has insomnia.   All other systems reviewed and are negative.   Blood pressure (!) 76/46, pulse 81, height 5\' 4"  (1.626 m), weight 120 lb (54.4 kg).Body mass index is 20.6 kg/m.  General Appearance: Fairly Groomed  Eye Contact:  Good  Speech:  Clear and Coherent  Volume:  Normal  Mood:  Anxious  Affect:  Appropriate, Congruent and less anxious, fearful at times  Thought Process:  Coherent and Goal Directed  Orientation:  Full (Time, Place, and Person)  Thought Content: Rumination Perceptions: denies AH/VH  Suicidal Thoughts:  No  Homicidal Thoughts:  No  Memory:  Immediate;   Good Recent;   Good Remote;   Good  Judgement:  Fair  Insight:  Present  Psychomotor Activity:  Normal  Concentration:  Concentration: Good and Attention Span: Good  Recall:  Good  Fund of Knowledge: Good  Language: Good  Akathisia:  No  Handed:  Right  AIMS (if indicated):  N/A  Assets:  Communication Skills Desire for Improvement  ADL's:  Intact  Cognition: WNL  Sleep:  fair   Assessment Janice Hampton is a 71 y.o. year old female with a history of depression, anxiety, Parkinson's disease, myasthenia gravis, hypothyroidism, cervical spondylossis, who presents for follow up appointment for Generalized anxiety disorder  Panic disorder  # GAD # Panic disorder Patient continues to endorse significant anxiety, rumination and has significant body weight loss (25 lbs loss over month) with poor PO intake. Evaluation for dysphagia per chart review indicates no organic cause of this symptom. Will uptitrate quetiapine to target anxiety, ego dystonic paranoia/rumination, given she has had adverse reaction to antidepressant (although it may be secondary to anxiety). Discussed risk of worsening in parkinson symptoms, and she is advised to take original dose if any worsening in symptoms. Will continue gabapentin to target anxiety; may consider uptitration at the  next encounter. Spent significant time on cognitive structuring, fear of avoidance. She is advised to work with her OT to eat meals, which may ease her anxiety. She is also instructed to drink more Ensure to have enough calories. Discussed with patient that she may need inpatient care if she continues to have weight loss with poor intake secondary to anxiety.   # Hypotension It is unclear whether it is related to her poor po intake and/or autonomic dysfunction secondary to parkinson. She denies any dizziness or weakness. She is advised to hydrate and increase po intake as above.  Plan 1. Continue gabapentin 100 mg twice a day and 300 mg at night 2. Continue clonazepam 0.5 mg twice a day as needed for anxiety 3. Increase quetiapine 25 mg at night 4. Return to clinic in three weeks for 30 mins 5. Try drinking ensure three times a day 6. Consider working with your occupational therapy to eat meals - She continues to see a therapist weekly   The patient demonstrates the following risk factors for suicide: Chronic risk factors for suicide include: psychiatric disorder of anxietyand chronic pain. Acute risk factorsfor suicide include: unemployment. Protective factorsfor this patient include: positive social support, responsibility to others (children, family), coping skills and hope for the future. Considering these factors, the overall suicide risk at this point appears to be low. Patient isappropriate for outpatient follow up.  Treatment Plan Summary:Plan as above  The duration of this appointment visit was 30 minutes of face-to-face time with the patient.  Greater than 50% of this time was spent in counseling, explanation of  diagnosis, planning of further management, and coordination of care.  Neysa Hottereina Kamir Selover, MD 01/17/2017, 1:52 PM

## 2017-01-17 ENCOUNTER — Ambulatory Visit (INDEPENDENT_AMBULATORY_CARE_PROVIDER_SITE_OTHER): Payer: 59 | Admitting: Psychiatry

## 2017-01-17 ENCOUNTER — Encounter (HOSPITAL_COMMUNITY): Payer: Self-pay | Admitting: Psychiatry

## 2017-01-17 VITALS — BP 76/46 | HR 81 | Ht 64.0 in | Wt 120.0 lb

## 2017-01-17 DIAGNOSIS — F410 Panic disorder [episodic paroxysmal anxiety] without agoraphobia: Secondary | ICD-10-CM | POA: Diagnosis not present

## 2017-01-17 DIAGNOSIS — G4700 Insomnia, unspecified: Secondary | ICD-10-CM

## 2017-01-17 DIAGNOSIS — F411 Generalized anxiety disorder: Secondary | ICD-10-CM

## 2017-01-17 DIAGNOSIS — Z87891 Personal history of nicotine dependence: Secondary | ICD-10-CM | POA: Diagnosis not present

## 2017-01-17 MED ORDER — GABAPENTIN 100 MG PO CAPS: 100.0000 mg | ORAL_CAPSULE | Freq: Two times a day (BID) | ORAL | 1 refills | Status: DC

## 2017-01-17 MED ORDER — GABAPENTIN 300 MG PO CAPS: 300.0000 mg | ORAL_CAPSULE | Freq: Every day | ORAL | 1 refills | Status: DC

## 2017-01-17 MED ORDER — QUETIAPINE FUMARATE 25 MG PO TABS: 25.0000 mg | ORAL_TABLET | Freq: Every day | ORAL | 1 refills | Status: DC

## 2017-01-17 NOTE — Patient Instructions (Signed)
1. Continue gabapentin 100 mg twice a day and 300 mg at night 2. Continue clonazepam 0.5 mg twice a day as needed for anxiety 3. Increase quetiapine 25 mg at night 4. Return to clinic in three weeks for 30 mins 5. Try drinking ensure three times a day 6. Consider working with your occupational therapy to eat meals

## 2017-01-22 ENCOUNTER — Ambulatory Visit (INDEPENDENT_AMBULATORY_CARE_PROVIDER_SITE_OTHER): Payer: 59 | Admitting: Clinical

## 2017-01-22 DIAGNOSIS — F064 Anxiety disorder due to known physiological condition: Secondary | ICD-10-CM

## 2017-01-24 ENCOUNTER — Telehealth (HOSPITAL_COMMUNITY): Payer: Self-pay | Admitting: *Deleted

## 2017-01-24 NOTE — Telephone Encounter (Signed)
Please ask if patient would like to proceed this. (She has high sensitivity to medication and we do not know if we continue this dose)

## 2017-01-24 NOTE — Telephone Encounter (Signed)
[facility] faxed office 90 days supply request for pt Quetiapine 25 mg QHS. Pt medication was last filled on 01-17-2017 with 30 tabs 1 refill. Pt pharmacy number is [phone].

## 2017-01-29 ENCOUNTER — Ambulatory Visit (INDEPENDENT_AMBULATORY_CARE_PROVIDER_SITE_OTHER): Payer: Medicare Other | Admitting: Clinical

## 2017-01-29 DIAGNOSIS — F064 Anxiety disorder due to known physiological condition: Secondary | ICD-10-CM | POA: Diagnosis not present

## 2017-01-30 ENCOUNTER — Telehealth (HOSPITAL_COMMUNITY): Payer: Self-pay | Admitting: *Deleted

## 2017-01-30 ENCOUNTER — Other Ambulatory Visit (HOSPITAL_COMMUNITY): Payer: Self-pay | Admitting: Psychiatry

## 2017-01-30 MED ORDER — CLONAZEPAM 0.5 MG PO TABS: 0.5000 mg | ORAL_TABLET | Freq: Two times a day (BID) | ORAL | 0 refills | Status: DC | PRN

## 2017-01-30 NOTE — Telephone Encounter (Signed)
Pt husband called stating pt is out of her Klonopin. Per pt chart, pt medication was last filled on 12-18-2016 with 60 tabs and 0 refill. Pt f/u appt is 02-07-2017. Per pt husband, pt have 7 days left of her medication. Pt husband number is [phone]. Per pt husband, pt had 2 panic attack yesterday. Per pt husband, as soon as pt wakes up in the morning pt takes a klonopin. But what happened that was different was pt went to her therapy appt and had a panic attack there and they gived her the second does for the day. Per pt husband, that night, had another panic attack and they did not know what to do because she had already had her second dose that afternoon. Asked pt husband if pt have gone a morning waking up and not take her Klonopin until she actually have a panic attack and husband stated no. Per pt husband, he would like to get advise as to what to do about pt panic attacks. Per pt husband he would like for Dr. Vanetta ShawlHisada to call him.

## 2017-01-30 NOTE — Telephone Encounter (Signed)
Called number that was provided by pt husband to call back and lmtcb. Office number provided

## 2017-01-30 NOTE — Telephone Encounter (Signed)
Called pharmacy to order clonazepam. Please advise them that she can take clonazepam 0.5 mg up to three times a day on rare occasion such like the other day she had a panic attack. Otherwise, advise her to stay on clonazepam 0.5 mg twice a day as needed.

## 2017-01-30 NOTE — Telephone Encounter (Signed)
Per pt husband to not do 90 days supply.

## 2017-01-31 NOTE — Telephone Encounter (Signed)
Pt husband called office back and stated that the medication that provider called in is only enough for 2 times a day. Per pt husband, if pt is taking hoer Klonopin up to 3 times a day on some days that are bad, she will run out of her medication sooner that month. Pt husband would like for provider to please call in Klonopin giving pt the safety net of being able to take that 3rd tablet for if she gets an episode.

## 2017-01-31 NOTE — Progress Notes (Signed)
BH MD/PA/NP OP Progress Note  02/07/2017 3:00 PM Janice Hampton  MRN:  [id number]  Chief Complaint:  Chief Complaint    Anxiety; Follow-up     Subjective:  "I'm feeling better" HPI:  Patient presents for follow up appointment for anxiety. She states that she feels better, although she is still anxious. She enjoyed going to high school soccer game the other day. She usually feels better when she is around with people. She started to do coloring book. She tends to feel anxious at night with fear of choking, stating that "somebody is trying to push me up." Although she knows it is not rational, she has this fear. She showed an exercise she was recommended by PT. She has a few panic attacks. She takes clonazepam up to three times per day. She drinks ensure three times per day. She ate meat loaf the other day. She denies insomnia. She denies SI, HI, AH/VH. She denies ideas of reference. She states that she could not take quetiapine 25 mg due to some side effect (she could not recollect)  Per [facility] Clonazepam filled on 01/30/2017 (60 tabs for 30 days)   Wt Readings from Last 3 Encounters:  02/07/17 123 lb 12.8 oz (56.2 kg)  01/17/17 120 lb (54.4 kg)  12/18/16 145 lb (65.8 kg)    Visit Diagnosis:    ICD-10-CM   1. Generalized anxiety disorder F41.1   2. Panic disorder F41.0     Past Psychiatric History:  I have reviewed the patient's psychiatry history in detail and updated the patient record.  Outpatient: denies Psychiatry admission: denies Previous suicide attempt: denies Past trials of medication: paroxetine (dry mouth), duloxetine (insomnia and increased muscle tightness), mirtazapine (rigidity, choking sensation), clonazepam History of violence: denies  Past Medical History:  Past Medical History:  Diagnosis Date  . Acute cystitis without hematuria   . Anxiety   . Decubitus ulcer of coccygeal region, stage 2   . Dehydration with hyponatremia   . Depression   .  Dysphagia   . Hypomagnesemia   . Hypothyroidism   . Myasthenia gravis (HCC)   . New onset atrial fibrillation (HCC)   . Pancytopenia (HCC) 05/28/2016  . Parkinson's disease (HCC)   . Syncope and collapse    No past surgical history on file.  Family Psychiatric History:  I have reviewed the patient's family history in detail and updated the patient record.  Family History: No family history on file.  Social History:  Social History   Social History  . Marital status: Married    Spouse name: N/A  . Number of children: N/A  . Years of education: N/A   Social History Main Topics  . Smoking status: Former Smoker    Types: Cigarettes    Quit date: 05/07/1977  . Smokeless tobacco: Never Used  . Alcohol use No  . Drug use: No  . Sexual activity: Not Currently   Other Topics Concern  . None   Social History Narrative  . None    Allergies: No Known Allergies  Metabolic Disorder Labs: No results found for: HGBA1C, MPG No results found for: PROLACTIN No results found for: CHOL, TRIG, HDL, CHOLHDL, VLDL, LDLCALC   Current Medications: Current Outpatient Prescriptions  Medication Sig Dispense Refill  . acetaminophen (TYLENOL) 325 MG tablet Take 650 mg by mouth 2 (two) times daily as needed.    . baclofen (LIORESAL) 10 MG tablet Take 10 mg by mouth 4 (four) times daily. Give at 10A  and 10P    . calcium-vitamin D (OSCAL WITH D) 500-200 MG-UNIT tablet Take 1 tablet by mouth daily with breakfast.    . carbidopa-levodopa (SINEMET CR) 50-200 MG tablet Take 1 tablet by mouth at bedtime.    . carbidopa-levodopa (SINEMET IR) 25-100 MG tablet Take by mouth. At 6 AM taking 2 tablets, at 10 am taking 2.5 tablets, At 2 pm taking 2 Tablets, At 6 pm taking 2 tablets, Around 2am-4am PRN Taking 1 Tablet.    . Cholecalciferol (VITAMIN D3 PO) Take 1,000 mg by mouth daily.    . clonazePAM (KLONOPIN) 0.5 MG tablet Take 1 tablet (0.5 mg total) by mouth 3 (three) times daily as needed for anxiety.  90 tablet 0  . CRANBERRY CONCENTRATE PO Take 800 mg by mouth daily.     Marland Kitchen gabapentin (NEURONTIN) 100 MG capsule Take 1 capsule (100 mg total) by mouth every evening. 30 capsule 1  . gabapentin (NEURONTIN) 300 MG capsule Take 1 capsule (300 mg total) by mouth 2 (two) times daily at 8 am and 10 pm. She also has 100 mg prescription to take in PM 60 capsule 1  . levothyroxine (SYNTHROID, LEVOTHROID) 100 MCG tablet Take 100 mcg by mouth daily before breakfast.    . Menthol (ICY HOT) 5 % PTCH Apply 1 application topically daily. Apply to left scapula at 6AM, remove at [facility]    . metoprolol succinate (TOPROL-XL) 25 MG 24 hr tablet Take 25 mg by mouth daily.    . Multiple Vitamins-Minerals (CENTRUM SILVER 50+WOMEN PO) Take by mouth daily.    . polyethylene glycol (MIRALAX / GLYCOLAX) packet Take 17 g by mouth. Drinking one Glass in AM and PRN in PM    . pramipexole (MIRAPEX) 1.5 MG tablet Take 1.5 mg by mouth. Taking 0.5 tablet in AM and 1 Tablet at noon    . predniSONE (DELTASONE) 5 MG tablet Take 5 mg by mouth 2 (two) times daily with a meal.     . QUEtiapine (SEROQUEL) 25 MG tablet Take 0.5 tablets (12.5 mg total) by mouth at bedtime. 15 tablet 1  . sennosides-docusate sodium (SENOKOT-S) 8.6-50 MG tablet Take 2 tablets by mouth 2 (two) times daily.     . traMADol (ULTRAM) 50 MG tablet Take 50 mg by mouth every 6 (six) hours as needed for moderate pain.     No current facility-administered medications for this visit.     Neurologic: Headache: No Seizure: No Paresthesias: No  Musculoskeletal: Strength & Muscle Tone: within normal limits Gait & Station: normal Patient leans: N/A  Psychiatric Specialty Exam: Review of Systems  Psychiatric/Behavioral: Negative for depression, hallucinations, substance abuse and suicidal ideas. The patient is nervous/anxious. The patient does not have insomnia.   All other systems reviewed and are negative.   Blood pressure (!) 152/89, pulse 85, height 5\' 4"   (1.626 m), weight 123 lb 12.8 oz (56.2 kg).Body mass index is 21.25 kg/m.  General Appearance: Fairly Groomed  Eye Contact:  Good  Speech:  Clear and Coherent  Volume:  Normal  Mood:  Anxious  Affect:  Appropriate, Congruent, Restricted and less anxious, smiles, calmer  Thought Process:  Coherent and Goal Directed  Orientation:  Full (Time, Place, and Person)  Thought Content: Paranoid Ideation Perceptions: denies AH/VH  Suicidal Thoughts:  No  Homicidal Thoughts:  No  Memory:  Immediate;   Good Recent;   Good Remote;   Good  Judgement:  Good  Insight:  Fair  Psychomotor Activity:  Normal  Concentration:  Concentration: Good and Attention Span: Good  Recall:  Good  Fund of Knowledge: Good  Language: Good  Akathisia:  NA  Handed:  Right  AIMS (if indicated):  N/A  Assets:  Communication Skills Desire for Improvement  ADL's:  Intact  Cognition: WNL  Sleep:  fair   Assessment Janice Hampton is a 71 y.o. year old female with a history of anxiety, Parkinson's disease, myasthenia gravis, hypothyroidism, , who presents for follow up appointment for Generalized anxiety disorder  Panic disorder  # GAD # Panic disorder There has been significant improvement in anxiety and has less rumination on difficulty with swallowing since the last appointment. Will uptitrate gabapentin to target anxiety. Will continue quetiapine as adjunctive treatment; she could not tolerate higher dose (details unknown). Will continue to monitor paranoia and parkinson symptoms. Will continue clonazepam anxiety; up the frequency so that she can engage in daily activity. Discussed risk of oversedation, dependence. Spent time discussing behavioral activation. She is encouraged to continue to see a therapist.   Plan 1. Increase gabapentin 300 mg - 100 mg - 300 mg  2. Continue clonazepam 0.5 mg up to three times a day as needed for anxiety (order another refill for 90 days) 3. Continue quetiapine 12.5 mg at  night 4. Return to clinic in one month for 30 mins  The patient demonstrates the following risk factors for suicide: Chronic risk factors for suicide include: psychiatric disorder of anxietyand chronic pain. Acute risk factorsfor suicide include: unemployment. Protective factorsfor this patient include: positive social support, responsibility to others (children, family), coping skills and hope for the future. Considering these factors, the overall suicide risk at this point appears to be low. Patient isappropriate for outpatient follow up.  Treatment Plan Summary:Plan as above  The duration of this appointment visit was 30 minutes of face-to-face time with the patient.  Greater than 50% of this time was spent in counseling, explanation of  diagnosis, planning of further management, and coordination of care.  Janice Hottereina Blayne Garlick, MD 02/07/2017, 3:00 PM

## 2017-01-31 NOTE — Telephone Encounter (Signed)
Would stay on 60 tabs for one month at this time, as they have appointment next week and would have more than enough medication till that date. Will discuss more at the next visit.

## 2017-02-05 ENCOUNTER — Ambulatory Visit (INDEPENDENT_AMBULATORY_CARE_PROVIDER_SITE_OTHER): Payer: 59 | Admitting: Clinical

## 2017-02-05 DIAGNOSIS — F064 Anxiety disorder due to known physiological condition: Secondary | ICD-10-CM | POA: Diagnosis not present

## 2017-02-05 NOTE — Telephone Encounter (Signed)
Called [phone] and was unable to reach pt. lmtcb and office number provided.

## 2017-02-07 ENCOUNTER — Encounter (HOSPITAL_COMMUNITY): Payer: Self-pay | Admitting: Psychiatry

## 2017-02-07 ENCOUNTER — Ambulatory Visit (INDEPENDENT_AMBULATORY_CARE_PROVIDER_SITE_OTHER): Payer: 59 | Admitting: Psychiatry

## 2017-02-07 VITALS — BP 152/89 | HR 85 | Ht 64.0 in | Wt 123.8 lb

## 2017-02-07 DIAGNOSIS — F410 Panic disorder [episodic paroxysmal anxiety] without agoraphobia: Secondary | ICD-10-CM

## 2017-02-07 DIAGNOSIS — F411 Generalized anxiety disorder: Secondary | ICD-10-CM

## 2017-02-07 DIAGNOSIS — G20 Parkinson's disease: Secondary | ICD-10-CM | POA: Diagnosis not present

## 2017-02-07 DIAGNOSIS — Z79899 Other long term (current) drug therapy: Secondary | ICD-10-CM | POA: Diagnosis not present

## 2017-02-07 DIAGNOSIS — E039 Hypothyroidism, unspecified: Secondary | ICD-10-CM | POA: Diagnosis not present

## 2017-02-07 DIAGNOSIS — Z87891 Personal history of nicotine dependence: Secondary | ICD-10-CM

## 2017-02-07 DIAGNOSIS — G7000 Myasthenia gravis without (acute) exacerbation: Secondary | ICD-10-CM | POA: Diagnosis not present

## 2017-02-07 MED ORDER — CLONAZEPAM 0.5 MG PO TABS: 0.5000 mg | ORAL_TABLET | Freq: Three times a day (TID) | ORAL | 0 refills | Status: DC | PRN

## 2017-02-07 MED ORDER — GABAPENTIN 300 MG PO CAPS: 300.0000 mg | ORAL_CAPSULE | Freq: Two times a day (BID) | ORAL | 1 refills | Status: DC

## 2017-02-07 MED ORDER — QUETIAPINE FUMARATE 25 MG PO TABS: 12.5000 mg | ORAL_TABLET | Freq: Every day | ORAL | 1 refills | Status: DC

## 2017-02-07 MED ORDER — GABAPENTIN 100 MG PO CAPS: 100.0000 mg | ORAL_CAPSULE | Freq: Every evening | ORAL | 1 refills | Status: DC

## 2017-02-07 NOTE — Patient Instructions (Signed)
1. Increase gabapentin 300 mg - 100 mg - 300 mg  2. Continue clonazepam 0.5 mg up to three times a day as needed for axiety 3. Continue quetiapine 12.5 mg at night 4. Return to clinic in one month for 30 mins

## 2017-02-12 ENCOUNTER — Ambulatory Visit (INDEPENDENT_AMBULATORY_CARE_PROVIDER_SITE_OTHER): Payer: 59 | Admitting: Clinical

## 2017-02-12 DIAGNOSIS — F064 Anxiety disorder due to known physiological condition: Secondary | ICD-10-CM

## 2017-02-19 ENCOUNTER — Telehealth (HOSPITAL_COMMUNITY): Payer: Self-pay | Admitting: *Deleted

## 2017-02-19 ENCOUNTER — Ambulatory Visit (INDEPENDENT_AMBULATORY_CARE_PROVIDER_SITE_OTHER): Payer: 59 | Admitting: Clinical

## 2017-02-19 DIAGNOSIS — F064 Anxiety disorder due to known physiological condition: Secondary | ICD-10-CM

## 2017-02-19 MED ORDER — QUETIAPINE FUMARATE 25 MG PO TABS: 12.5000 mg | ORAL_TABLET | Freq: Every day | ORAL | 0 refills | Status: DC

## 2017-02-19 NOTE — Telephone Encounter (Signed)
Per previous message on file, provider authorized RMA to send in 15 tabs of pt Quetiapine 25 mg 0.5 tabs QHS and to make sure pt have f/u appt with her original provider. Per pt chart, pt have f/u appt with Dr. Vanetta ShawlHisada on 03-05-2017. Also per pt chart, pt Seroquel was last filled on 02-07-2017 15 tabs 1 refill. Per previous message, pharmacy wanted 90 days supply for pt medication. Per request from fill in provider, staff e-scribed pt Seroquel to her pharmacy for 15 tabs 0 refill.

## 2017-02-19 NOTE — Telephone Encounter (Signed)
Per Dr. Tenny Crawoss request, staff sent 15 more tab with 0 refill to pt pharmacy on file via e-scribe.

## 2017-02-19 NOTE — Telephone Encounter (Signed)
You may send in another 15 tablets, make sure she has appt scheduled

## 2017-02-19 NOTE — Telephone Encounter (Signed)
[facility] 90 days supply request for pt Quetiapine 25 mg 0.5 tabs QHS. Per fax, the requested quantity is 45. Per pt chart, pt medication was last filled on 02-07-2017 with 15 tabs 1 refills. Pt pharmacy number is [phone].

## 2017-02-20 NOTE — Telephone Encounter (Signed)
noted 

## 2017-02-21 NOTE — Telephone Encounter (Signed)
Noted  

## 2017-02-27 ENCOUNTER — Ambulatory Visit (INDEPENDENT_AMBULATORY_CARE_PROVIDER_SITE_OTHER): Payer: Medicare Other | Admitting: Clinical

## 2017-02-27 DIAGNOSIS — F064 Anxiety disorder due to known physiological condition: Secondary | ICD-10-CM

## 2017-03-04 ENCOUNTER — Ambulatory Visit (INDEPENDENT_AMBULATORY_CARE_PROVIDER_SITE_OTHER): Payer: Medicare Other | Admitting: Clinical

## 2017-03-04 DIAGNOSIS — F064 Anxiety disorder due to known physiological condition: Secondary | ICD-10-CM

## 2017-03-05 ENCOUNTER — Ambulatory Visit (INDEPENDENT_AMBULATORY_CARE_PROVIDER_SITE_OTHER): Payer: Medicare Other | Admitting: Psychiatry

## 2017-03-05 ENCOUNTER — Encounter (HOSPITAL_COMMUNITY): Payer: Self-pay | Admitting: Psychiatry

## 2017-03-05 VITALS — BP 104/58 | HR 76 | Ht 64.0 in | Wt 117.2 lb

## 2017-03-05 DIAGNOSIS — F410 Panic disorder [episodic paroxysmal anxiety] without agoraphobia: Secondary | ICD-10-CM

## 2017-03-05 DIAGNOSIS — R450 Nervousness: Secondary | ICD-10-CM | POA: Diagnosis not present

## 2017-03-05 DIAGNOSIS — Z87891 Personal history of nicotine dependence: Secondary | ICD-10-CM

## 2017-03-05 DIAGNOSIS — E039 Hypothyroidism, unspecified: Secondary | ICD-10-CM | POA: Diagnosis not present

## 2017-03-05 DIAGNOSIS — F411 Generalized anxiety disorder: Secondary | ICD-10-CM

## 2017-03-05 DIAGNOSIS — G20 Parkinson's disease: Secondary | ICD-10-CM

## 2017-03-05 DIAGNOSIS — G7000 Myasthenia gravis without (acute) exacerbation: Secondary | ICD-10-CM | POA: Diagnosis not present

## 2017-03-05 MED ORDER — QUETIAPINE FUMARATE 25 MG PO TABS: 12.5000 mg | ORAL_TABLET | Freq: Every day | ORAL | 0 refills | Status: DC

## 2017-03-05 MED ORDER — GABAPENTIN 300 MG PO CAPS: 300.0000 mg | ORAL_CAPSULE | Freq: Three times a day (TID) | ORAL | 1 refills | Status: DC

## 2017-03-05 MED ORDER — CLONAZEPAM 0.5 MG PO TABS
0.5000 mg | ORAL_TABLET | Freq: Three times a day (TID) | ORAL | 0 refills | Status: DC | PRN
Start: 2017-03-05 — End: 2017-09-11

## 2017-03-05 NOTE — Patient Instructions (Signed)
1. Increase gabapentin 300 mg three times a day 2. Continue quetiapine 12.5 mg at night 3. Continue clonazepam 0.5 mg three times day  4. Return to clinic in one month for 30 mins

## 2017-03-05 NOTE — Progress Notes (Addendum)
BH MD/PA/NP OP Progress Note  03/05/2017 2:32 PM Janice Hampton  MRN:  [id number]  Chief Complaint:  Chief Complaint    Follow-up; Anxiety     HPI:  She presents for follow up appointment for anxiety. She states that gabapentin makes her "stiff" in the morning. She is not able to do things for a few days in the morning. She states that she has "voice (though)" telling her that she cannot eat; she talks with this voice that he/she can come to a house for a tea, if he/she wants a friend. She feels that it works well for her. She states that she knows that she can eat and walk although voice says the opposite. She enjoys coloring, making scraps. She enjoys teaching her 34 year old grandchildren about making scraps. She enjoys going to soccer game. She has good appetite and enjoyed eating a steak the other day. She feels less tense except the time she had stiffness. She has a few panic attacks. She takes clonazepam prn up to three times a day for anxiety. She denies feeling depressed. She denies SI.   Her husband presents to the interview.  He believes that "stiffness" comes from parkinson's disease, as she seems to do better after taking Sinemet.   Wt Readings from Last 3 Encounters:  03/05/17 117 lb 3.2 oz (53.2 kg)  02/07/17 123 lb 12.8 oz (56.2 kg)  01/17/17 120 lb (54.4 kg)    Per [facility] database Clonazepam prescribed on 02/28/2017  Visit Diagnosis:    ICD-10-CM   1. Generalized anxiety disorder F41.1   2. Panic disorder F41.0     Past Psychiatric History:  I have reviewed the patient's psychiatry history in detail and updated the patient record. Outpatient: denies Psychiatry admission: denies Previous suicide attempt: denies Past trials of medication: paroxetine (dry mouth), duloxetine (insomnia and increased muscle tightness), mirtazapine (rigidity, choking sensation), clonazepam History of violence: denies  Past Medical History:  Past Medical History:  Diagnosis Date  .  Acute cystitis without hematuria   . Anxiety   . Decubitus ulcer of coccygeal region, stage 2   . Dehydration with hyponatremia   . Depression   . Dysphagia   . Hypomagnesemia   . Hypothyroidism   . Myasthenia gravis (HCC)   . New onset atrial fibrillation (HCC)   . Pancytopenia (HCC) 05/28/2016  . Parkinson's disease (HCC)   . Syncope and collapse    No past surgical history on file.  Family Psychiatric History:  I have reviewed the patient's family history in detail and updated the patient record.  Family History: No family history on file.  Social History:  Social History   Social History  . Marital status: Married    Spouse name: N/A  . Number of children: N/A  . Years of education: N/A   Social History Main Topics  . Smoking status: Former Smoker    Types: Cigarettes    Quit date: 05/07/1977  . Smokeless tobacco: Never Used  . Alcohol use No  . Drug use: No  . Sexual activity: Not Currently   Other Topics Concern  . None   Social History Narrative  . None    Allergies: No Known Allergies  Metabolic Disorder Labs: No results found for: HGBA1C, MPG No results found for: PROLACTIN No results found for: CHOL, TRIG, HDL, CHOLHDL, VLDL, LDLCALC Lab Results  Component Value Date   TSH 1.45 05/29/2016    Therapeutic Level Labs: No results found for: LITHIUM No  results found for: VALPROATE No components found for:  CBMZ  Current Medications: Current Outpatient Prescriptions  Medication Sig Dispense Refill  . acetaminophen (TYLENOL) 325 MG tablet Take 650 mg by mouth 2 (two) times daily as needed.    . baclofen (LIORESAL) 10 MG tablet Take 10 mg by mouth 4 (four) times daily. Give at 10A and 10P    . calcium-vitamin D (OSCAL WITH D) 500-200 MG-UNIT tablet Take 1 tablet by mouth daily with breakfast.    . carbidopa-levodopa (SINEMET CR) 50-200 MG tablet Take 1 tablet by mouth at bedtime.    . carbidopa-levodopa (SINEMET IR) 25-100 MG tablet Take by mouth.  At 6 AM taking 2 tablets, at 10 am taking 2.5 tablets, At 2 pm taking 2 Tablets, At 6 pm taking 2 tablets, Around 2am-4am PRN Taking 1 Tablet.    . Cholecalciferol (VITAMIN D3 PO) Take 1,000 mg by mouth daily.    . clonazePAM (KLONOPIN) 0.5 MG tablet Take 1 tablet (0.5 mg total) by mouth 3 (three) times daily as needed for anxiety. 90 tablet 0  . CRANBERRY CONCENTRATE PO Take 800 mg by mouth daily.     Marland Kitchen gabapentin (NEURONTIN) 300 MG capsule Take 1 capsule (300 mg total) by mouth 3 (three) times daily. 90 capsule 1  . levothyroxine (SYNTHROID, LEVOTHROID) 100 MCG tablet Take 100 mcg by mouth daily before breakfast.    . Menthol (ICY HOT) 5 % PTCH Apply 1 application topically daily. Apply to left scapula at 6AM, remove at [facility]    . metoprolol succinate (TOPROL-XL) 25 MG 24 hr tablet Take 25 mg by mouth daily.    . Multiple Vitamins-Minerals (CENTRUM SILVER 50+WOMEN PO) Take by mouth daily.    . polyethylene glycol (MIRALAX / GLYCOLAX) packet Take 17 g by mouth. Drinking one Glass in AM and PRN in PM    . pramipexole (MIRAPEX) 1.5 MG tablet Take 1.5 mg by mouth. Taking 0.5 tablet in AM and 1 Tablet at noon    . predniSONE (DELTASONE) 5 MG tablet Take 5 mg by mouth 2 (two) times daily with a meal.     . QUEtiapine (SEROQUEL) 25 MG tablet Take 0.5 tablets (12.5 mg total) by mouth at bedtime. 15 tablet 0  . sennosides-docusate sodium (SENOKOT-S) 8.6-50 MG tablet Take 2 tablets by mouth 2 (two) times daily.     . traMADol (ULTRAM) 50 MG tablet Take 50 mg by mouth every 6 (six) hours as needed for moderate pain.     No current facility-administered medications for this visit.      Musculoskeletal: Strength & Muscle Tone: decreased Gait & Station: in a wheel chair Patient leans: N/A  Psychiatric Specialty Exam: Review of Systems  Psychiatric/Behavioral: Negative for depression, hallucinations, substance abuse and suicidal ideas. The patient is nervous/anxious. The patient does not have insomnia.    All other systems reviewed and are negative.   Blood pressure (!) 104/58, pulse 76, height  (1.626 m), weight 117 lb 3.2 oz (53.2 kg).Body mass index is 20.12 kg/m.  General Appearance: Fairly Groomed  Eye Contact:  Good  Speech:  Clear and Coherent  Volume:  Normal  Mood:  Anxious  Affect:  Appropriate, Congruent and calmer, less anxious  Thought Process:  Coherent and Goal Directed  Orientation:  Full (Time, Place, and Person)  Thought Content: mild rumination of "thought/voice of she cannot eat" Perceptions: denies AH/VH  Suicidal Thoughts:  No  Homicidal Thoughts:  No  Memory:  Immediate;  Good Recent;   Good Remote;   Good  Judgement:  Good  Insight:  Fair  Psychomotor Activity:  Normal  Concentration:  Concentration: Good and Attention Span: Good  Recall:  Good  Fund of Knowledge: Good  Language: Good  Akathisia:  No  Handed:  Right  AIMS (if indicated): not done  Assets:  Communication Skills Desire for Improvement  ADL's:  Intact  Cognition: WNL  Sleep:  Good   Screenings:   Assessment and Plan:  Janice Hampton is a 71 y.o. year old female with a history of anxiety, parkinson's disease, myasthenia gravis, hypothyroidism , who presents for follow up appointment for Generalized anxiety disorder  Panic disorder  # GAD # Panic disorder Exam is notable for less rumination on anxiety, which coincided with uptitrating gabapentin and continuing therapy. Reported worsening stiffness is likely secondary to parkinson's disease (alleviated after taking sinemet per her husband), although will continue to monitor it. Will uptitrate gabapentin to target residual symptoms of anxiety. Will continue quetiapine as adjunctive treatment (could not tolerate higher dose). Will continue clonazepam prn for anxiety. Discussed risk of fall and oversedation. Discussed behavioral activation. Discussed cognitive restructuring. She is encouraged to see a therapist.   Plan 1. Increase  gabapentin 300 mg three times a day (she will call the clinic if any worsening after uptitration of medication. ) 2. Continue quetiapine 12.5 mg at night 3. Continue clonazepam 0.5 mg three times day  4. Return to clinic in one month for 30 mins   The patient demonstrates the following risk factors for suicide: Chronic risk factors for suicide include: psychiatric disorder of anxietyand chronic pain. Acute risk factorsfor suicide include: unemployment. Protective factorsfor this patient include: positive social support, responsibility to others (children, family), coping skills and hope for the future. Considering these factors, the overall suicide risk at this point appears to be low. Patient isappropriate for outpatient follow up.  The duration of this appointment visit was 30 minutes of face-to-face time with the patient.  Greater than 50% of this time was spent in counseling, explanation of  diagnosis, planning of further management, and coordination of care.  Neysa Hotter, MD 03/05/2017, 2:32 PM

## 2017-03-07 ENCOUNTER — Ambulatory Visit: Payer: 59 | Admitting: Clinical

## 2017-03-12 ENCOUNTER — Ambulatory Visit (INDEPENDENT_AMBULATORY_CARE_PROVIDER_SITE_OTHER): Payer: Medicare Other | Admitting: Clinical

## 2017-03-12 ENCOUNTER — Telehealth (HOSPITAL_COMMUNITY): Payer: Self-pay | Admitting: *Deleted

## 2017-03-12 ENCOUNTER — Other Ambulatory Visit (HOSPITAL_COMMUNITY): Payer: Self-pay | Admitting: Psychiatry

## 2017-03-12 DIAGNOSIS — F064 Anxiety disorder due to known physiological condition: Secondary | ICD-10-CM

## 2017-03-12 MED ORDER — GABAPENTIN 100 MG PO CAPS: 100.0000 mg | ORAL_CAPSULE | Freq: Every evening | ORAL | 1 refills | Status: DC

## 2017-03-12 NOTE — Telephone Encounter (Signed)
Pt called stating the new medication that provider put her on is not working. Per pt she cant explain as to what it's doing to her. Per pt it feels like she's choking when she takes the medication. Per pt she don't know if she should stop the medication or go back to another dosage. Per pt this have been going on for about 4 days not and she can't stand this. Per pt this medication is not working. Per pt the medication is Gabapentin. Pt number is [phone].

## 2017-03-12 NOTE — Telephone Encounter (Signed)
Although this medication would not usually cause feeling of choking, she can decrease back the dose if she does not feel comfortable continuing the current dose. Advise her to take gabapentin 300 mg - 100 mg - 300 mg. I ordered 100 mg caps in case she does not have any.

## 2017-03-14 NOTE — Telephone Encounter (Signed)
Spoke with pt and informed her with information and pt verified understanding.

## 2017-03-19 ENCOUNTER — Ambulatory Visit (INDEPENDENT_AMBULATORY_CARE_PROVIDER_SITE_OTHER): Payer: Medicare Other | Admitting: Clinical

## 2017-03-19 DIAGNOSIS — F064 Anxiety disorder due to known physiological condition: Secondary | ICD-10-CM

## 2017-03-25 DIAGNOSIS — F064 Anxiety disorder due to known physiological condition: Secondary | ICD-10-CM

## 2017-03-28 ENCOUNTER — Ambulatory Visit (INDEPENDENT_AMBULATORY_CARE_PROVIDER_SITE_OTHER): Payer: Medicare Other | Admitting: Clinical

## 2017-03-28 DIAGNOSIS — F064 Anxiety disorder due to known physiological condition: Secondary | ICD-10-CM

## 2017-04-01 ENCOUNTER — Ambulatory Visit (INDEPENDENT_AMBULATORY_CARE_PROVIDER_SITE_OTHER): Payer: Medicare Other | Admitting: Clinical

## 2017-04-01 DIAGNOSIS — F064 Anxiety disorder due to known physiological condition: Secondary | ICD-10-CM

## 2017-04-01 NOTE — Progress Notes (Deleted)
BH MD/PA/NP OP Progress Note  04/01/2017 2:41 PM Janice Hampton  MRN:  [id number]  Chief Complaint:  HPI:  - Gabapentin was decreased back to 300 mg - 100 mg - 300 mg due to her reported side effect of choking  Visit Diagnosis: No diagnosis found.  Past Psychiatric History:  I have reviewed the patient's psychiatry history in detail and updated the patient record. Outpatient: denies Psychiatry admission: denies Previous suicide attempt: denies Past trials of medication: paroxetine (dry mouth), duloxetine (insomnia and increased muscle tightness), mirtazapine (rigidity, choking sensation), clonazepam History of violence: denies  Past Medical History:  Past Medical History:  Diagnosis Date  . Acute cystitis without hematuria   . Anxiety   . Decubitus ulcer of coccygeal region, stage 2   . Dehydration with hyponatremia   . Depression   . Dysphagia   . Hypomagnesemia   . Hypothyroidism   . Myasthenia gravis (HCC)   . New onset atrial fibrillation (HCC)   . Pancytopenia (HCC) 05/28/2016  . Parkinson's disease (HCC)   . Syncope and collapse    No past surgical history on file.  Family Psychiatric History:  I have reviewed the patient's family history in detail and updated the patient record.  Family History: No family history on file.  Social History:  Social History   Social History  . Marital status: Married    Spouse name: N/A  . Number of children: N/A  . Years of education: N/A   Social History Main Topics  . Smoking status: Former Smoker    Types: Cigarettes    Quit date: 05/07/1977  . Smokeless tobacco: Never Used  . Alcohol use No  . Drug use: No  . Sexual activity: Not Currently   Other Topics Concern  . Not on file   Social History Narrative  . No narrative on file    Allergies: No Known Allergies  Metabolic Disorder Labs: No results found for: HGBA1C, MPG No results found for: PROLACTIN No results found for: CHOL, TRIG, HDL, CHOLHDL, VLDL,  LDLCALC Lab Results  Component Value Date   TSH 1.45 05/29/2016    Therapeutic Level Labs: No results found for: LITHIUM No results found for: VALPROATE No components found for:  CBMZ  Current Medications: Current Outpatient Prescriptions  Medication Sig Dispense Refill  . acetaminophen (TYLENOL) 325 MG tablet Take 650 mg by mouth 2 (two) times daily as needed.    . baclofen (LIORESAL) 10 MG tablet Take 10 mg by mouth 4 (four) times daily. Give at 10A and 10P    . calcium-vitamin D (OSCAL WITH D) 500-200 MG-UNIT tablet Take 1 tablet by mouth daily with breakfast.    . carbidopa-levodopa (SINEMET CR) 50-200 MG tablet Take 1 tablet by mouth at bedtime.    . carbidopa-levodopa (SINEMET IR) 25-100 MG tablet Take by mouth. At 6 AM taking 2 tablets, at 10 am taking 2.5 tablets, At 2 pm taking 2 Tablets, At 6 pm taking 2 tablets, Around 2am-4am PRN Taking 1 Tablet.    . Cholecalciferol (VITAMIN D3 PO) Take 1,000 mg by mouth daily.    . clonazePAM (KLONOPIN) 0.5 MG tablet Take 1 tablet (0.5 mg total) by mouth 3 (three) times daily as needed for anxiety. 90 tablet 0  . CRANBERRY CONCENTRATE PO Take 800 mg by mouth daily.     Marland Kitchen gabapentin (NEURONTIN) 100 MG capsule Take 1 capsule (100 mg total) by mouth every evening. Take gabapentin 300 mg - 100 mg - 300 mg  30 capsule 1  . gabapentin (NEURONTIN) 300 MG capsule Take 1 capsule (300 mg total) by mouth 3 (three) times daily. 90 capsule 1  . levothyroxine (SYNTHROID, LEVOTHROID) 100 MCG tablet Take 100 mcg by mouth daily before breakfast.    . Menthol (ICY HOT) 5 % PTCH Apply 1 application topically daily. Apply to left scapula at 6AM, remove at [facility]    . metoprolol succinate (TOPROL-XL) 25 MG 24 hr tablet Take 25 mg by mouth daily.    . Multiple Vitamins-Minerals (CENTRUM SILVER 50+WOMEN PO) Take by mouth daily.    . polyethylene glycol (MIRALAX / GLYCOLAX) packet Take 17 g by mouth. Drinking one Glass in AM and PRN in PM    . pramipexole (MIRAPEX)  1.5 MG tablet Take 1.5 mg by mouth. Taking 0.5 tablet in AM and 1 Tablet at noon    . predniSONE (DELTASONE) 5 MG tablet Take 5 mg by mouth 2 (two) times daily with a meal.     . QUEtiapine (SEROQUEL) 25 MG tablet Take 0.5 tablets (12.5 mg total) by mouth at bedtime. 15 tablet 0  . sennosides-docusate sodium (SENOKOT-S) 8.6-50 MG tablet Take 2 tablets by mouth 2 (two) times daily.     . traMADol (ULTRAM) 50 MG tablet Take 50 mg by mouth every 6 (six) hours as needed for moderate pain.     No current facility-administered medications for this visit.      Musculoskeletal: Strength & Muscle Tone: within normal limits Gait & Station: normal Patient leans: N/A  Psychiatric Specialty Exam: ROS  There were no vitals taken for this visit.There is no height or weight on file to calculate BMI.  General Appearance: Fairly Groomed  Eye Contact:  Good  Speech:  Clear and Coherent  Volume:  Normal  Mood:  {BHH MOOD:22306}  Affect:  {Affect (PAA):22687}  Thought Process:  Coherent and Goal Directed  Orientation:  Full (Time, Place, and Person)  Thought Content: Logical   Suicidal Thoughts:  {ST/HT (PAA):22692}  Homicidal Thoughts:  {ST/HT (PAA):22692}  Memory:  Immediate;   Good Recent;   Good Remote;   Good  Judgement:  {Judgement (PAA):22694}  Insight:  {Insight (PAA):22695}  Psychomotor Activity:  Normal  Concentration:  Concentration: Good and Attention Span: Good  Recall:  Good  Fund of Knowledge: Good  Language: Good  Akathisia:  No  Handed:  Right  AIMS (if indicated): not done  Assets:  Communication Skills Desire for Improvement  ADL's:  Intact  Cognition: WNL  Sleep:  {BHH GOOD/FAIR/POOR:22877}   Screenings:   Assessment and Plan:  Janice Hampton is a 71 y.o. year old female with a history of anxiety, parkinson's disease, myasthenia gravis, hypothyroidism , who presents for follow up appointment for No diagnosis found.  # GAD # Panic disorder  Exam is notable for  less rumination on anxiety, which coincided with uptitrating gabapentin and continuing therapy. Reported worsening stiffness is likely secondary to parkinson's disease (alleviated after taking sinemet per her husband), although will continue to monitor it. Will uptitrate gabapentin to target residual symptoms of anxiety. Will continue quetiapine as adjunctive treatment (could not tolerate higher dose). Will continue clonazepam prn for anxiety. Discussed risk of fall and oversedation. Discussed behavioral activation. Discussed cognitive restructuring. She is encouraged to see a therapist.   Plan 1. Increase gabapentin 300 mg three times a day (she will call the clinic if any worsening after uptitration of medication. ) 2. Continue quetiapine 12.5 mg at night 3. Continue clonazepam  0.5 mg three times day  4. Return to clinic in one month for 30 mins   The patient demonstrates the following risk factors for suicide: Chronic risk factors for suicide include: psychiatric disorder of anxietyand chronic pain. Acute risk factorsfor suicide include: unemployment. Protective factorsfor this patient include: positive social support, responsibility to others (children, family), coping skills and hope for the future. Considering these factors, the overall suicide risk at this point appears to be low. Patient isappropriate for outpatient follow up.   Neysa Hotter, MD 04/01/2017, 2:41 PM

## 2017-04-04 ENCOUNTER — Ambulatory Visit (HOSPITAL_COMMUNITY): Payer: Medicare Other | Admitting: Psychiatry

## 2017-04-04 ENCOUNTER — Other Ambulatory Visit (HOSPITAL_COMMUNITY): Payer: Self-pay | Admitting: Psychiatry

## 2017-04-04 NOTE — Telephone Encounter (Signed)
She had to cancel the appointment today as she could not come up via elevator (it is broken). Discussed on the phone with patient.   She states that she does not feel good and feels more saliva. Per chart review, she went to ED for this complaint and was prescribed guaifenicin. She finds medication to be helpful. Discussed with patient that causes are multifactorial, which includes parkinson symptoms and anxiety. It is less likely that gabapentin is causing her symptoms. She has an appointment with her neurologist next week. Will await for their evaluation.    Will continue gabapentin at this time; there is a refill order for another month. She is advised to make an appointment next week for 30 mins.

## 2017-04-08 ENCOUNTER — Ambulatory Visit (INDEPENDENT_AMBULATORY_CARE_PROVIDER_SITE_OTHER): Payer: Medicare Other | Admitting: Clinical

## 2017-04-08 DIAGNOSIS — F064 Anxiety disorder due to known physiological condition: Secondary | ICD-10-CM | POA: Diagnosis not present

## 2017-04-16 ENCOUNTER — Telehealth (HOSPITAL_COMMUNITY): Payer: Self-pay | Admitting: *Deleted

## 2017-04-16 ENCOUNTER — Ambulatory Visit (INDEPENDENT_AMBULATORY_CARE_PROVIDER_SITE_OTHER): Payer: Medicare Other | Admitting: Clinical

## 2017-04-16 DIAGNOSIS — F064 Anxiety disorder due to known physiological condition: Secondary | ICD-10-CM

## 2017-04-16 NOTE — Telephone Encounter (Signed)
Pt husband called stating he would like for Dr. Vanetta ShawlHisada to call him when she gets the chance. Per pt husband it's about pt medication that's causing pt issues. Per pt husband another doctor is suggesting that they decrease one of doctor Hisada's meds and per husband he wants to talk with Dr. Vanetta ShawlHIsada himself. Pt husband number is [phone].

## 2017-04-16 NOTE — Telephone Encounter (Signed)
Please ask them what is the issue.

## 2017-04-17 NOTE — Telephone Encounter (Signed)
Discussed with patient and her husband. She believes that "freezing" comes from gabapentin. She talked with a neurologist, who recommended to decreased the dose.  Noted that she also states that she has more freezing since the last time she decreased the dose from 300 to 100 mg at lunch time dose. After discussion, will try tapering down gabapentin to see if it helps (so that she can see if medication is causing side effect or not). Differential includes worsening anxiety with somatic symptoms.   - Decrease gabapentin 300-100-100 mg this week, then 100-100-100 mg next week - She will follow up on 11/6 at 4 PM

## 2017-04-17 NOTE — Telephone Encounter (Signed)
appt scheduled with pt husband

## 2017-04-17 NOTE — Telephone Encounter (Signed)
Per pt husband he would like to talk with Dr Vanetta ShawlHisada about medication possibilities and discuss what the other provider informed them and what they should do. Per pt husband, he prefer provider to call him back to discuss this information. Pt husband number is [phone]

## 2017-04-22 NOTE — Progress Notes (Deleted)
BH MD/PA/NP OP Progress Note  04/22/2017 1:00 PM Janice KidneyJean M Bahner  MRN:  [id number]  Chief Complaint:  HPI: *** Visit Diagnosis: No diagnosis found.  Past Psychiatric History:  I have reviewed the patient's psychiatry history in detail and updated the patient record. Outpatient: denies Psychiatry admission: denies Previous suicide attempt: denies Past trials of medication: paroxetine (dry mouth), duloxetine (insomnia and increased muscle tightness), mirtazapine (rigidity, choking sensation), clonazepam History of violence: denies    Past Medical History:  Past Medical History:  Diagnosis Date  . Acute cystitis without hematuria   . Anxiety   . Decubitus ulcer of coccygeal region, stage 2   . Dehydration with hyponatremia   . Depression   . Dysphagia   . Hypomagnesemia   . Hypothyroidism   . Myasthenia gravis (HCC)   . New onset atrial fibrillation (HCC)   . Pancytopenia (HCC) 05/28/2016  . Parkinson's disease (HCC)   . Syncope and collapse    No past surgical history on file.  Family Psychiatric History:  I have reviewed the patient's family history in detail and updated the patient record.   Family History: No family history on file.  Social History:  Social History   Socioeconomic History  . Marital status: Married    Spouse name: Not on file  . Number of children: Not on file  . Years of education: Not on file  . Highest education level: Not on file  Social Needs  . Financial resource strain: Not on file  . Food insecurity - worry: Not on file  . Food insecurity - inability: Not on file  . Transportation needs - medical: Not on file  . Transportation needs - non-medical: Not on file  Occupational History  . Not on file  Tobacco Use  . Smoking status: Former Smoker    Types: Cigarettes    Last attempt to quit: 05/07/1977    Years since quitting: 39.9  . Smokeless tobacco: Never Used  Substance and Sexual Activity  . Alcohol use: No  . Drug use: No  .  Sexual activity: Not Currently  Other Topics Concern  . Not on file  Social History Narrative  . Not on file    Allergies: No Known Allergies  Metabolic Disorder Labs: No results found for: HGBA1C, MPG No results found for: PROLACTIN No results found for: CHOL, TRIG, HDL, CHOLHDL, VLDL, LDLCALC Lab Results  Component Value Date   TSH 1.45 05/29/2016    Therapeutic Level Labs: No results found for: LITHIUM No results found for: VALPROATE No components found for:  CBMZ  Current Medications: Current Outpatient Medications  Medication Sig Dispense Refill  . acetaminophen (TYLENOL) 325 MG tablet Take 650 mg by mouth 2 (two) times daily as needed.    . baclofen (LIORESAL) 10 MG tablet Take 10 mg by mouth 4 (four) times daily. Give at 10A and 10P    . calcium-vitamin D (OSCAL WITH D) 500-200 MG-UNIT tablet Take 1 tablet by mouth daily with breakfast.    . carbidopa-levodopa (SINEMET CR) 50-200 MG tablet Take 1 tablet by mouth at bedtime.    . carbidopa-levodopa (SINEMET IR) 25-100 MG tablet Take by mouth. At 6 AM taking 2 tablets, at 10 am taking 2.5 tablets, At 2 pm taking 2 Tablets, At 6 pm taking 2 tablets, Around 2am-4am PRN Taking 1 Tablet.    . Cholecalciferol (VITAMIN D3 PO) Take 1,000 mg by mouth daily.    . clonazePAM (KLONOPIN) 0.5 MG tablet Take 1 tablet (  0.5 mg total) by mouth 3 (three) times daily as needed for anxiety. 90 tablet 0  . CRANBERRY CONCENTRATE PO Take 800 mg by mouth daily.     Marland Kitchen gabapentin (NEURONTIN) 100 MG capsule Take 1 capsule (100 mg total) by mouth every evening. Take gabapentin 300 mg - 100 mg - 300 mg 30 capsule 1  . gabapentin (NEURONTIN) 300 MG capsule Take 1 capsule (300 mg total) by mouth 3 (three) times daily. 90 capsule 1  . levothyroxine (SYNTHROID, LEVOTHROID) 100 MCG tablet Take 100 mcg by mouth daily before breakfast.    . Menthol (ICY HOT) 5 % PTCH Apply 1 application topically daily. Apply to left scapula at 6AM, remove at [facility]    .  metoprolol succinate (TOPROL-XL) 25 MG 24 hr tablet Take 25 mg by mouth daily.    . Multiple Vitamins-Minerals (CENTRUM SILVER 50+WOMEN PO) Take by mouth daily.    . polyethylene glycol (MIRALAX / GLYCOLAX) packet Take 17 g by mouth. Drinking one Glass in AM and PRN in PM    . pramipexole (MIRAPEX) 1.5 MG tablet Take 1.5 mg by mouth. Taking 0.5 tablet in AM and 1 Tablet at noon    . predniSONE (DELTASONE) 5 MG tablet Take 5 mg by mouth 2 (two) times daily with a meal.     . QUEtiapine (SEROQUEL) 25 MG tablet Take 0.5 tablets (12.5 mg total) by mouth at bedtime. 15 tablet 0  . sennosides-docusate sodium (SENOKOT-S) 8.6-50 MG tablet Take 2 tablets by mouth 2 (two) times daily.     . traMADol (ULTRAM) 50 MG tablet Take 50 mg by mouth every 6 (six) hours as needed for moderate pain.     No current facility-administered medications for this visit.      Musculoskeletal: Strength & Muscle Tone: decreased Gait & Station: on wheel chair Patient leans: N/A  Psychiatric Specialty Exam: ROS  There were no vitals taken for this visit.There is no height or weight on file to calculate BMI.  General Appearance: Fairly Groomed  Eye Contact:  Good  Speech:  Clear and Coherent  Volume:  Normal  Mood:  {BHH MOOD:22306}  Affect:  {Affect (PAA):22687}  Thought Process:  Coherent and Goal Directed  Orientation:  Full (Time, Place, and Person)  Thought Content: Logical   Suicidal Thoughts:  {ST/HT (PAA):22692}  Homicidal Thoughts:  {ST/HT (PAA):22692}  Memory:  Immediate;   Good Recent;   Good Remote;   Good  Judgement:  {Judgement (PAA):22694}  Insight:  {Insight (PAA):22695}  Psychomotor Activity:  Normal  Concentration:  Concentration: Good and Attention Span: Good  Recall:  Good  Fund of Knowledge: Good  Language: {BHH GOOD/FAIR/POOR:22877}  Akathisia:  No  Handed:  Right  AIMS (if indicated): not done  Assets:  Communication Skills Desire for Improvement  ADL's:  Intact  Cognition: WNL   Sleep:  {BHH GOOD/FAIR/POOR:22877}   Screenings:   Assessment and Plan:  Janice Hampton is a 71 y.o. year old female with a history of anxiety, parkinson's disease,myasthenia gravis, hypothyroidism , who presents for follow up appointment for No diagnosis found.   # GAD # Panic disorder  Exam is notable for less rumination on anxiety, which coincided with uptitrating gabapentin and continuing therapy. Reported worsening stiffness is likely secondary to parkinson's disease (alleviated after taking sinemet per her husband), although will continue to monitor it. Will uptitrate gabapentin to target residual symptoms of anxiety. Will continue quetiapine as adjunctive treatment (could not tolerate higher dose). Will continue  clonazepam prn for anxiety. Discussed risk of fall and oversedation. Discussed behavioral activation. Discussed cognitive restructuring. She is encouraged to see a therapist.   Plan 1. Increase gabapentin 300 mg three times a day (she will call the clinic if any worsening after uptitration of medication. ) 2. Continue quetiapine 12.5 mg at night 3. Continue clonazepam 0.5 mg three times day  4. Return to clinic in one month for 30 mins   The patient demonstrates the following risk factors for suicide: Chronic risk factors for suicide include: psychiatric disorder of anxietyand chronic pain. Acute risk factorsfor suicide include: unemployment. Protective factorsfor this patient include: positive social support, responsibility to others (children, family), coping skills and hope for the future. Considering these factors, the overall suicide risk at this point appears to be low. Patient isappropriate for outpatient follow up.     Neysa Hotter, MD 04/22/2017, 1:00 PM

## 2017-04-23 ENCOUNTER — Ambulatory Visit (HOSPITAL_COMMUNITY): Payer: Self-pay | Admitting: Psychiatry

## 2017-04-23 ENCOUNTER — Ambulatory Visit: Payer: Medicare Other | Admitting: Clinical

## 2017-04-23 ENCOUNTER — Other Ambulatory Visit (HOSPITAL_COMMUNITY): Payer: Self-pay | Admitting: Family Medicine

## 2017-04-23 DIAGNOSIS — R1319 Other dysphagia: Secondary | ICD-10-CM

## 2017-04-26 ENCOUNTER — Telehealth (HOSPITAL_COMMUNITY): Payer: Self-pay | Admitting: *Deleted

## 2017-04-26 NOTE — Telephone Encounter (Signed)
Pt pharmacy [facility] [place] requesting refills for pt Clonazepam. Per pt chart, pt medication was last filled on 03-05-2017 with 90 tabs 0 refill. Pt do not have f/u appt. Pt pharmacy number is [phone].

## 2017-04-26 NOTE — Telephone Encounter (Signed)
She needs an appointment first.

## 2017-05-02 ENCOUNTER — Other Ambulatory Visit (HOSPITAL_COMMUNITY): Payer: Self-pay

## 2017-05-02 ENCOUNTER — Ambulatory Visit: Payer: Medicare Other | Admitting: Clinical

## 2017-05-02 ENCOUNTER — Ambulatory Visit (HOSPITAL_COMMUNITY): Payer: Self-pay

## 2017-05-02 DIAGNOSIS — F064 Anxiety disorder due to known physiological condition: Secondary | ICD-10-CM | POA: Diagnosis not present

## 2017-05-06 ENCOUNTER — Ambulatory Visit: Payer: Medicare Other | Admitting: Clinical

## 2017-05-14 ENCOUNTER — Ambulatory Visit: Payer: Medicare Other | Admitting: Clinical

## 2017-05-14 DIAGNOSIS — F064 Anxiety disorder due to known physiological condition: Secondary | ICD-10-CM | POA: Diagnosis not present

## 2017-05-20 ENCOUNTER — Ambulatory Visit: Payer: Medicare Other | Admitting: Clinical

## 2017-05-20 DIAGNOSIS — F064 Anxiety disorder due to known physiological condition: Secondary | ICD-10-CM

## 2017-05-28 ENCOUNTER — Ambulatory Visit: Payer: Medicare Other | Admitting: Clinical

## 2017-05-30 MED ORDER — SODIUM CHLORIDE 0.9 % IV SOLN
500.00 mL | INTRAVENOUS | Status: DC
Start: 2017-05-30 — End: 2017-05-30

## 2017-06-07 ENCOUNTER — Ambulatory Visit: Payer: Medicare Other | Admitting: Clinical

## 2017-06-07 DIAGNOSIS — F064 Anxiety disorder due to known physiological condition: Secondary | ICD-10-CM | POA: Diagnosis not present

## 2017-06-20 ENCOUNTER — Ambulatory Visit: Payer: Medicare Other | Admitting: Clinical

## 2017-06-20 DIAGNOSIS — F064 Anxiety disorder due to known physiological condition: Secondary | ICD-10-CM | POA: Diagnosis not present

## 2017-06-25 ENCOUNTER — Ambulatory Visit: Payer: Medicare Other | Admitting: Clinical

## 2017-06-25 DIAGNOSIS — F064 Anxiety disorder due to known physiological condition: Secondary | ICD-10-CM | POA: Diagnosis not present

## 2017-07-02 ENCOUNTER — Ambulatory Visit: Payer: Medicare Other | Admitting: Clinical

## 2017-07-09 ENCOUNTER — Ambulatory Visit: Payer: Medicare Other | Admitting: Clinical

## 2017-07-09 DIAGNOSIS — F064 Anxiety disorder due to known physiological condition: Secondary | ICD-10-CM | POA: Diagnosis not present

## 2017-07-16 ENCOUNTER — Ambulatory Visit: Payer: Medicare Other | Admitting: Clinical

## 2017-09-11 ENCOUNTER — Ambulatory Visit (HOSPITAL_COMMUNITY): Payer: Medicare Other | Admitting: Psychiatry

## 2017-09-11 ENCOUNTER — Encounter (HOSPITAL_COMMUNITY): Payer: Self-pay | Admitting: Psychiatry

## 2017-09-11 VITALS — BP 110/72 | HR 76 | Ht 65.0 in | Wt 107.0 lb

## 2017-09-11 DIAGNOSIS — R45851 Suicidal ideations: Secondary | ICD-10-CM | POA: Diagnosis not present

## 2017-09-11 DIAGNOSIS — F29 Unspecified psychosis not due to a substance or known physiological condition: Secondary | ICD-10-CM | POA: Diagnosis not present

## 2017-09-11 DIAGNOSIS — Z87891 Personal history of nicotine dependence: Secondary | ICD-10-CM | POA: Diagnosis not present

## 2017-09-11 DIAGNOSIS — G20 Parkinson's disease: Secondary | ICD-10-CM

## 2017-09-11 DIAGNOSIS — F411 Generalized anxiety disorder: Secondary | ICD-10-CM | POA: Diagnosis not present

## 2017-09-11 MED ORDER — CLONAZEPAM 0.5 MG PO TABS: 0.5000 mg | ORAL_TABLET | Freq: Four times a day (QID) | ORAL | 3 refills | Status: AC

## 2017-09-11 MED ORDER — QUETIAPINE FUMARATE 50 MG PO TABS: 50.0000 mg | ORAL_TABLET | Freq: Four times a day (QID) | ORAL | 3 refills | Status: AC

## 2017-09-11 NOTE — Patient Instructions (Addendum)
READ about clozapine and we can talk more at follow-up  Clozapine tablets What is this medicine? CLOZAPINE (KLOE za peen) is used to treat schizophrenia. This medicine is only used when others have not worked. It has a risk of serious side effects and is only available through a monitoring and dispensing system that includes special doctors, pharmacists, and laboratories. For the first few months of treatment, you will be required to have routine blood testing before your prescription can be refilled. This medicine may be used for other purposes; ask your health care provider or pharmacist if you have questions. COMMON BRAND NAME(S): Clozaril What should I tell my health care provider before I take this medicine? They need to know if you have any of these conditions: -being treated for cancer -blood disease or disorder, like leukemia -cigarette smoker -constipation, fecal impaction, or a history of an obstruction of the intestine -dementia -diabetes -heart disease -history of irregular heartbeat -kidney disease -liver disease -low blood counts, like low white cell, platelet, or red cell counts -low levels of potassium or magnesium in the blood -lung or breathing disease, like asthma -Parkinson's disease -seizures -an unusual or allergic reaction to clozapine, other medicines, foods, dyes, or preservatives -pregnant or trying to get pregnant -breast-feeding How should I use this medicine? Take this medicine by mouth with a glass of water. Follow the directions on the prescription label. This medicine may be taken with or without food. Take your doses at regular intervals. Do not take your medicine more often than directed. Do not suddenly stop taking this medicine. You may need to gradually reduce the dose. Only stop taking this medicine on the advice of your doctor or health care professional. Talk to your pediatrician regarding the use of this medicine in children. Special care may be  needed. Overdosage: If you think you have taken too much of this medicine contact a poison control center or emergency room at once. NOTE: This medicine is only for you. Do not share this medicine with others. What if I miss a dose? If you miss a dose, take it as soon as you can. If it is almost time for your next dose, take only that dose. Do not take double or extra doses. If you miss your medicine for more than 2 days, you should not restart your medicine at the same dose. Contact your doctor for instructions. What may interact with this medicine? Do not take this medicine with any of the following medications: -certain medicines for fungal infections like fluconazole, itraconazole, ketoconazole, posaconazole, voriconazole -cisapride -dofetilide -dronedarone -mesoridazine -metoclopramide -pimozide -quinidine -thioridazine -ziprasidone This medicine may also interact with the following medications: -alcohol -antihistamines for allergy, cough and cold -atropine -birth control pills -caffeine -certain antibiotics like ciprofloxacin, erythromycin, mefloquine, moxifloxacin, pentamidine, rifabutin, rifampin -certain medicines for bladder problems like oxybutynin, tolterodine -certain medicines for blood pressure -certain medicines for cancer -certain medicines for depression, anxiety, or psychotic disturbances -certain medicines for irregular heart beat like amiodarone, encainide, flecainide, propafenone, sotalol -certain medicines for Parkinson's disease like benztropine, trihexyphenidyl -certain medicines for seizures -certain medicines for sleep -certain medicines for stomach problems like cimetidine, dicyclomine, dolasetron, hyoscyamine -certain medicines for travel sickness like scopolamine -ipratropium -lithium -medicines that lower your chance of fighting infection -methadone -nicotine -other medicines that prolong the QT interval (cause an abnormal heart rhythm) -skeletal  muscle relaxants -St. John's Wort -tacrolimus -terbinafine This list may not describe all possible interactions. Give your health care provider a list of all the medicines,  herbs, non-prescription drugs, or dietary supplements you use. Also tell them if you smoke, drink alcohol, or use illegal drugs. Some items may interact with your medicine. What should I watch for while using this medicine? Visit your doctor or health care professional for regular checks on your progress. It may be several weeks before you see the full effects of this medicine. Contact your doctor or health care professional if your symptoms get worse or if you have new symptoms. All patients receiving clozapine must be enrolled in a program called the Clozapine REMS Program. Your doctor is responsible for making sure you are enrolled in this program. You must have a weekly blood test when you first begin this medicine. If your blood counts stay in the right range, your doctor will reduce the frequency of the testing. Be sure to report any side effects from using clozapine to your healthcare provider. For more information, visit the Clozapine REMS Program website at www.clozapinerems.com and read the material in the information guide called "What You Need to Know about Clozapine and Neutropenia: A Guide for Patients and Caregivers". You may get drowsy or dizzy. Do not drive, use machinery, or do anything that needs mental alertness until you know how this drug affects you. Do not stand or sit up quickly, especially if you are an older patient. This reduces the risk of dizzy or fainting spells. Alcohol can make you more drowsy and dizzy. Avoid alcoholic drinks. Do not treat yourself for colds, fever, diarrhea or allergies. Ask your doctor or health care professional for advice, some nonprescription medicines may increase possible side effects. If you notice an increased hunger or thirst, different from your normal hunger or thirst, or if  you find that you have to urinate more frequently, you should contact your health care provider as soon as possible. You may need to have your blood sugar monitored. This medicine may cause changes in your blood sugar levels. You should monitor you blood sugar frequently if you have diabetes. If you smoke, tell your doctor if you notice this medicine is not working well for you. Talk to your doctor if you are a smoker or if you decide to stop smoking. If you are going to have surgery tell your doctor or health care professional that you are taking this medicine. This medicine can reduce the response of your body to heat or cold. Dress warm in cold weather and stay hydrated in hot weather. If possible, avoid extreme temperatures like saunas, hot tubs, very hot or cold showers, or activities that can cause dehydration such as vigorous exercise. What side effects may I notice from receiving this medicine? Side effects that you should report to your doctor or health care professional as soon as possible: -allergic reactions like skin rash, itching or hives, swelling of the face, lips, or tongue -breathing problems -changes in vision -chest pain, fast or irregular heartbeat -confusion -difficulty sleeping, nightmares -excessive thirst and/or hunger -feeling faint or lightheaded, falls -fever, chills, sore throat, or mouth sores -muscle and joint aches and pains -nausea, vomiting, or severe loss of appetite -restlessness -seizures -shortness of breath, chest pain, swelling in a leg -stiffness, spasms, trembling -trouble passing urine or change in the amount of urine -trouble with balance, talking, walking -uncontrollable tongue or chewing movements, smacking lips or puffing cheeks -unusually weak or tired -yellowing of the eyes, skin Side effects that usually do not require medical attention (report to your doctor or health care professional if they continue or  are  bothersome): -constipation -dry mouth -headache -increased watering of the mouth, drooling -weight gain This list may not describe all possible side effects. Call your doctor for medical advice about side effects. You may report side effects to FDA at 1-800-FDA-1088. Where should I keep my medicine? Keep out of the reach of children. Store at room temperature below 30 degrees C (86 degrees F). Throw away any unused medicine after the expiration date. NOTE: This sheet is a summary. It may not cover all possible information. If you have questions about this medicine, talk to your doctor, pharmacist, or health care provider.  2018 Elsevier/Gold Standard (2014-03-15 13:55:26)

## 2017-09-11 NOTE — Progress Notes (Signed)
BH MD/PA/NP OP Progress Note  09/11/2017 2:37 PM Janice Hampton  MRN:  [id number]  Chief Complaint:  I am anxious, I think I am dying HPI: Janice Hampton presents for psychiatric transfer of care.  She had previously been seen by Dr. Vanetta Shawl at the [place] office, and was most recently seen by [facility] in [facility].  They report that they were referred here by Dr. Dewayne Hatch for psychiatric assessment for medication management.  The patient presents with severe symptoms of Parkinson's disease, associated somatic preoccupation, delusional thinking, paranoia, and reports of visual and auditory hallucinations in the evenings.  She presents with a fairly constricted affect, is in a wheelchair, periodically tearful throughout our interaction.  She is not able to participate meaningfully in the psychiatric interview, as she is mostly preoccupied with the idea that she is dying.  She specifies that she feels like she is actively dying in this moment as she is speaking with Clinical research associate.  She shares that she feels like she has 3 tubes in her body that connect to her stomach, and to her lungs, and a third tube that is killing her.    She clarifies that she does not want to kill herself, that she is afraid of dying, but she also feels that death would be better than the amount of suffering that she is in.  Her husband reports that this is her baseline behavior much of the day over the past few months.  She was diagnosed with Parkinson's disease approximately 8 years ago and it has progressed over that time..  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  She has been assessed by hospice and palliative care, and they would be willing to proceed with hospice once she gets to be more appropriate for hospice inpatient care.  Until then, their primary concern  is related to her quality of life and to reduce her suffering throughout the day.  She is currently taking clonazepam 0.5 mg 4 times a day.  I expressed that clonazepam significantly increases the risk of falls, cognitive decline, but it also seems to be adding some value in terms of anxiety reduction.  We spent time discussing consideration of increase of Seroquel.  She is currently on Seroquel 100 mg nightly.  We discussed the concern that Seroquel may worsen her motor symptoms, and agreed to spread out the dose of Seroquel or times a day with clonazepam, for a total daily dose of 200 mg.  We also discussed introducing clozapine in lieu of Seroquel, but the patient has a history of pancytopenia and response to medications in the past.  She has a history of myasthenia gravis and got pancytopenia in response to an immune modulating drug that she had taken years ago.  She also has a history of atrial fibrillation.  Spent time discussing the high risk of eliciting pancytopenia, leukocytosis, or causing cardiac dysfunction.  Regardless, family would like to read more about clozapine and consider this intervention.  We agreed to follow-up in 6 weeks, and I spent time with the husband making sure that he is aware of caregiver support groups and is also working on addressing his own grief.  He was receptive to this.  Visit Diagnosis:    ICD-10-CM   1. Psychosis, unspecified psychosis type (HCC) F29 clonazePAM (KLONOPIN) 0.5 MG tablet  QUEtiapine (SEROQUEL) 50 MG tablet  2. Parkinson's disease (HCC) G20   3. Generalized anxiety disorder F41.1     Past Psychiatric History: History of outpatient treatment  Past Medical History:  Past Medical History:  Diagnosis Date  . Acute cystitis without hematuria   . Anxiety   . Decubitus ulcer of coccygeal region, stage 2   . Dehydration with hyponatremia   . Depression   . Dysphagia   . Hypomagnesemia   . Hypothyroidism   . Myasthenia gravis (HCC)   . New  onset atrial fibrillation (HCC)   . Pancytopenia (HCC) 05/28/2016  . Parkinson's disease (HCC)   . Syncope and collapse     Past Surgical History:  Procedure Laterality Date  . ABDOMINAL HYSTERECTOMY      Family Psychiatric History: See intake H&P for full details. Reviewed, with no updates at this time.   Family History: History reviewed. No pertinent family history.  Social History:  Social History   Socioeconomic History  . Marital status: Married    Spouse name: Not on file  . Number of children: Not on file  . Years of education: Not on file  . Highest education level: Not on file  Occupational History  . Not on file  Social Needs  . Financial resource strain: Not on file  . Food insecurity:    Worry: Not on file    Inability: Not on file  . Transportation needs:    Medical: Not on file    Non-medical: Not on file  Tobacco Use  . Smoking status: Former Smoker    Types: Cigarettes    Last attempt to quit: 05/07/1977    Years since quitting: 40.3  . Smokeless tobacco: Never Used  Substance and Sexual Activity  . Alcohol use: No  . Drug use: No  . Sexual activity: Not Currently  Lifestyle  . Physical activity:    Days per week: Not on file    Minutes per session: Not on file  . Stress: Not on file  Relationships  . Social connections:    Talks on phone: Not on file    Gets together: Not on file    Attends religious service: Not on file    Active member of club or organization: Not on file    Attends meetings of clubs or organizations: Not on file    Relationship status: Not on file  Other Topics Concern  . Not on file  Social History Narrative  . Not on file    Allergies: No Known Allergies  Metabolic Disorder Labs: No results found for: HGBA1C, MPG No results found for: PROLACTIN No results found for: CHOL, TRIG, HDL, CHOLHDL, VLDL, LDLCALC Lab Results  Component Value Date   TSH 1.45 05/29/2016    Therapeutic Level Labs: No results found  for: LITHIUM No results found for: VALPROATE No components found for:  CBMZ  Current Medications: Current Outpatient Medications  Medication Sig Dispense Refill  . acetaminophen (TYLENOL) 325 MG tablet Take 650 mg by mouth 2 (two) times daily as needed.    . baclofen (LIORESAL) 10 MG tablet Take 10 mg by mouth 4 (four) times daily. Give at 10A and 10P    . calcium-vitamin D (OSCAL WITH D) 500-200 MG-UNIT tablet Take 1 tablet by mouth daily with breakfast.    . carbidopa-levodopa (SINEMET CR) 50-200 MG tablet Take 1 tablet by mouth at bedtime.    . carbidopa-levodopa (SINEMET IR) 25-100 MG tablet Take by mouth. At 6  AM taking 2 tablets, at 10 am taking 2.5 tablets, At 2 pm taking 2 Tablets, At 6 pm taking 2 tablets, Around 2am-4am PRN Taking 1 Tablet.    . Cholecalciferol (VITAMIN D3 PO) Take 1,000 mg by mouth daily.    . clonazePAM (KLONOPIN) 0.5 MG tablet Take 1 tablet (0.5 mg total) by mouth 4 (four) times daily. 120 tablet 3  . levothyroxine (SYNTHROID, LEVOTHROID) 100 MCG tablet Take 100 mcg by mouth daily before breakfast.    . Menthol (ICY HOT) 5 % PTCH Apply 1 application topically daily. Apply to left scapula at 6AM, remove at [facility]6PM    . metoprolol succinate (TOPROL-XL) 25 MG 24 hr tablet Take 25 mg by mouth daily.    . Multiple Vitamins-Minerals (CENTRUM SILVER 50+WOMEN PO) Take by mouth daily.    . polyethylene glycol (MIRALAX / GLYCOLAX) packet Take 17 g by mouth. Drinking one Glass in AM and PRN in PM    . pramipexole (MIRAPEX) 1.5 MG tablet Take 1.5 mg by mouth. Taking 0.5 tablet in AM and 1 Tablet at noon    . predniSONE (DELTASONE) 5 MG tablet Take 5 mg by mouth 2 (two) times daily with a meal.     . QUEtiapine (SEROQUEL) 50 MG tablet Take 1 tablet (50 mg total) by mouth 4 (four) times daily. 120 tablet 3  . sennosides-docusate sodium (SENOKOT-S) 8.6-50 MG tablet Take 2 tablets by mouth 2 (two) times daily.     . traMADol (ULTRAM) 50 MG tablet Take 50 mg by mouth every 6 (six)  hours as needed for moderate pain.     No current facility-administered medications for this visit.      Musculoskeletal: Strength & Muscle Tone: abnormal and atrophy Gait & Station: unable to stand Patient leans: N/A  Psychiatric Specialty Exam: ROS  Blood pressure 110/72, pulse 76, height 5\' 5"  (1.651 m), weight 107 lb (48.5 kg).Body mass index is 17.81 kg/m.  General Appearance: Casual and Disheveled  Eye Contact:  Minimal  Speech:  Garbled and Slow  Volume:  Decreased  Mood:  Depressed, Dysphoric and Hopeless  Affect:  Congruent, Constricted and Depressed  Thought Process:  Irrelevant and Descriptions of Associations: Tangential  Orientation:  Full (Time, Place, and Person)  Thought Content: Illogical and Delusions   Suicidal Thoughts:  Yes.  without intent/plan  Homicidal Thoughts:  No  Memory:  Immediate;   Fair  Judgement:  Impaired  Insight:  Lacking  Psychomotor Activity:  Psychomotor Retardation  Concentration:  Attention Span: Poor  Recall:  Fair  Fund of Knowledge: Fair  Language: Fair  Akathisia:  Negative  Handed:  Right  AIMS (if indicated): not done  Assets:  Desire for Improvement Financial Resources/Insurance Housing Social Support  ADL's:  Intact  Cognition: WNL  Sleep:  Fair   Screenings:   Assessment and Plan:  Sharla KidneyJean M Wahid is a 72 year old female with Parkinson's disease, myasthenia gravis, with Parkinson's associated psychotic symptoms, and mood and anxiety symptoms.  Her husband of 47 years is present today with her for the visit and able to provide much of the relevant psychiatric history and past medication trials.  On a day-to-day basis she continues to struggle with delusional thinking, convinced that she cannot swallow although there are no medical etiologies contributing to this.  She has become quite emaciated over the years from poor p.o. food and fluid intake.  She has been assessed by hospice and palliative care and does have a  hospice nurse visit  her at the home every week.  There is also additional support of 8 hours of caretaker care per week.  The patient is incredibly ill with regard to her medical issues, and struggles with a significant fear of death and dying.  This is complicated by her delusional thinking and preoccupation with death, throughout our encounter she periodically said suddenly that she feels like she is dying, and was convinced that death was coming for her at that moment.  Her husband reports that this has been her baseline for approximately a year.  We discussed consideration of clozapine, but ultimately opted to increase her total daily dose of Seroquel in combination with clonazepam.  We will follow-up in 6 weeks, and I have also provided them reading material for consideration of clozapine if we do ultimately come to that decision.  1. Psychosis, unspecified psychosis type (HCC)   2. Parkinson's disease (HCC)   3. Generalized anxiety disorder     Status of current problems: unchanged  Labs Ordered: No orders of the defined types were placed in this encounter.   Labs Reviewed: N/A  Collateral Obtained/Records Reviewed: Husband was able to provide collateral regarding her behaviors, and daily function  Plan:  Discontinue gabapentin given lack of any benefit with regard to anxiety, and increased polypharmacy Clonazepam 0.5 mg tablet 4 times daily Seroquel 50 mg tablet 4 times daily Consider clozapine for Parkinson's related psychosis Return to clinic in 6-8 weeks  I spent 40 minutes with the patient in direct face-to-face clinical care.  Greater than 50% of this time was spent in counseling and coordination of care with the patient.    Burnard Leigh, MD 09/11/2017, 2:37 PM

## 2017-10-18 ENCOUNTER — Encounter (HOSPITAL_COMMUNITY): Payer: Self-pay | Admitting: Psychiatry

## 2017-11-06 ENCOUNTER — Ambulatory Visit (HOSPITAL_COMMUNITY): Payer: Self-pay | Admitting: Psychiatry

## 2018-03-18 DEATH — deceased

## 2019-01-12 IMAGING — DX DG SHOULDER 2+V*L*
4 series · 4 of 4 positions shown · non-contrast
Comparison: None.

CLINICAL DATA: Fall with shoulder pain

EXAM:
LEFT SHOULDER - 2+ VIEW

[shoulder grashey]
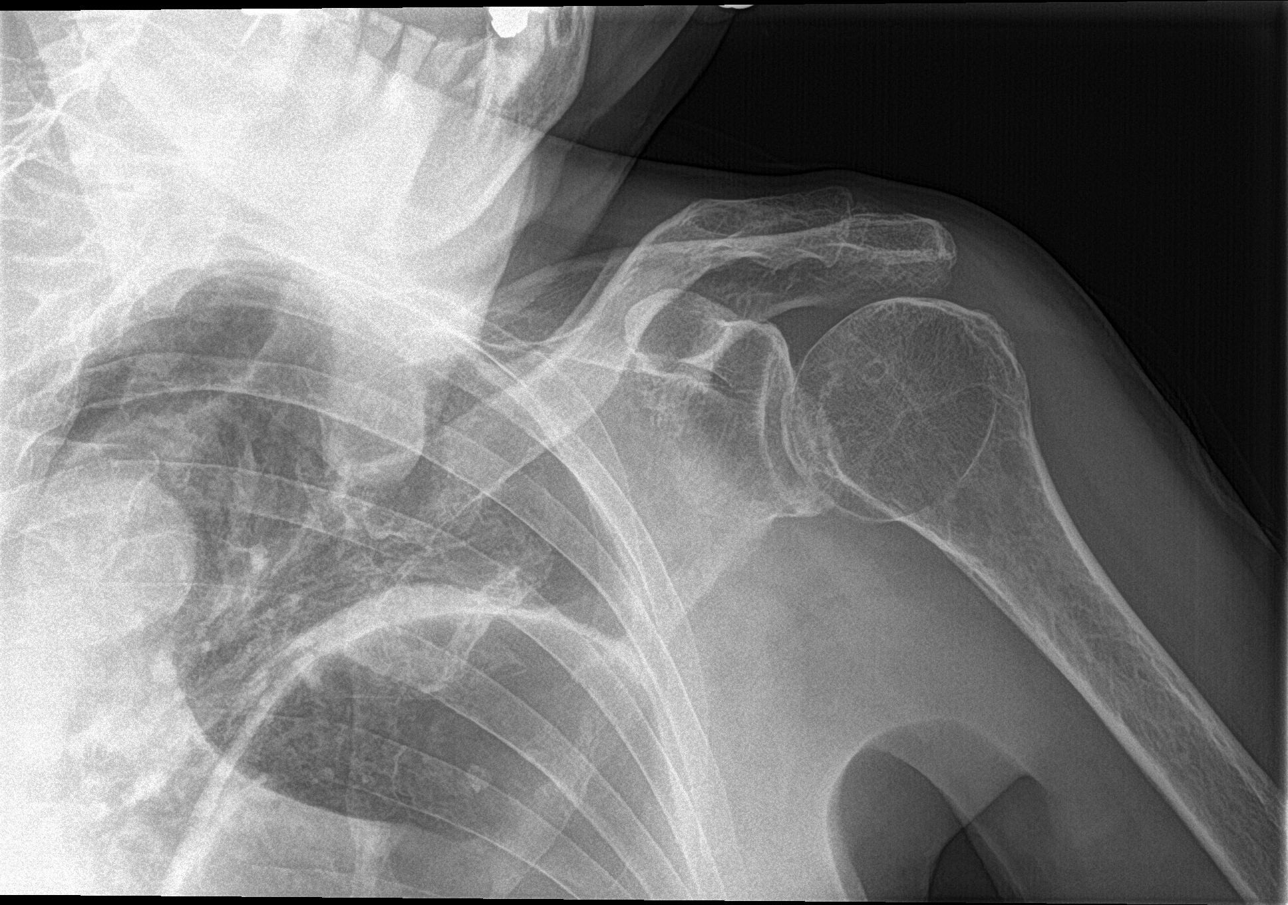

[shoulder y view]
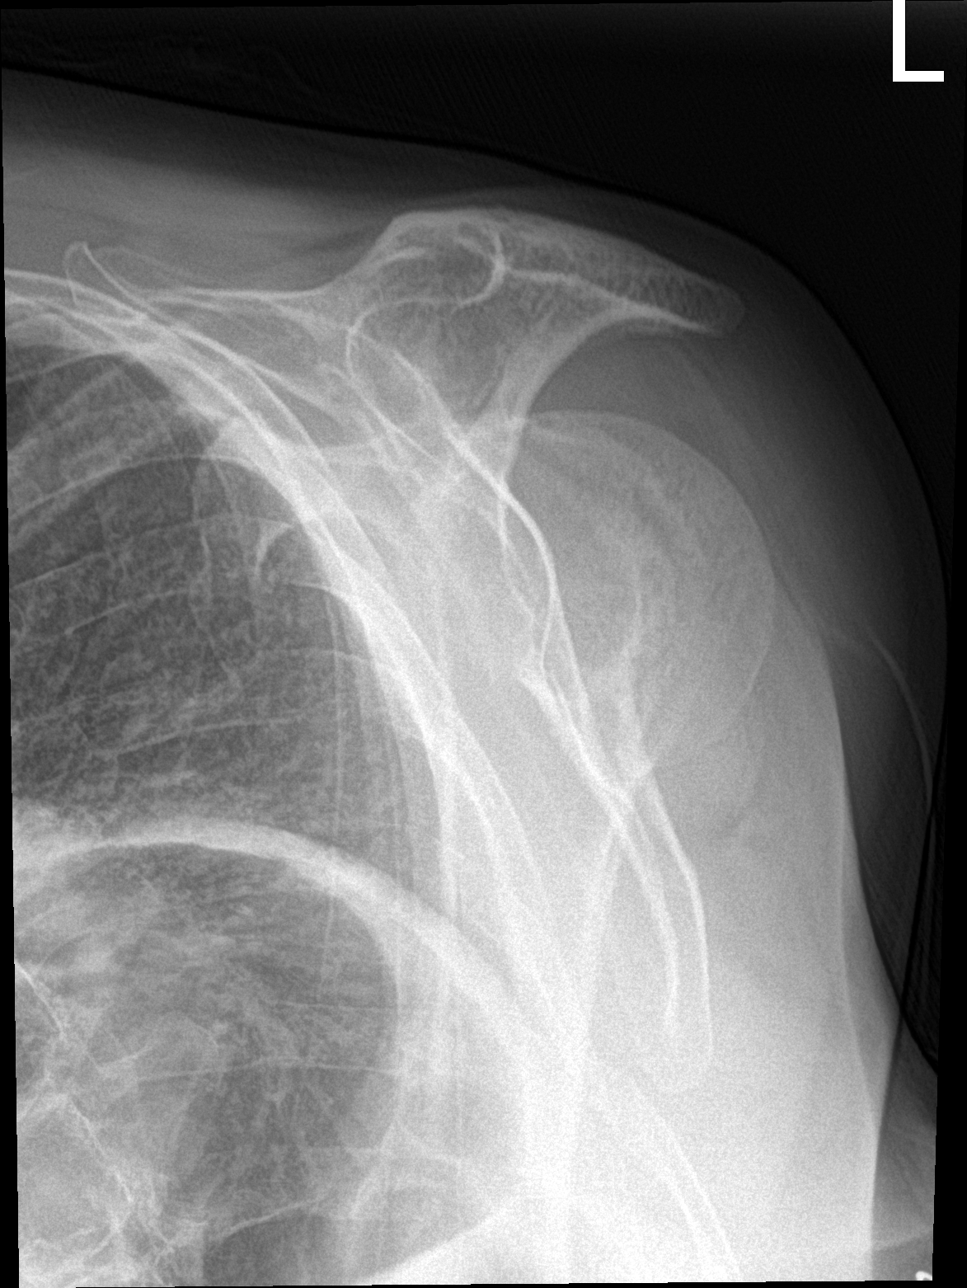

[shoulder axillary (1 of 2)]
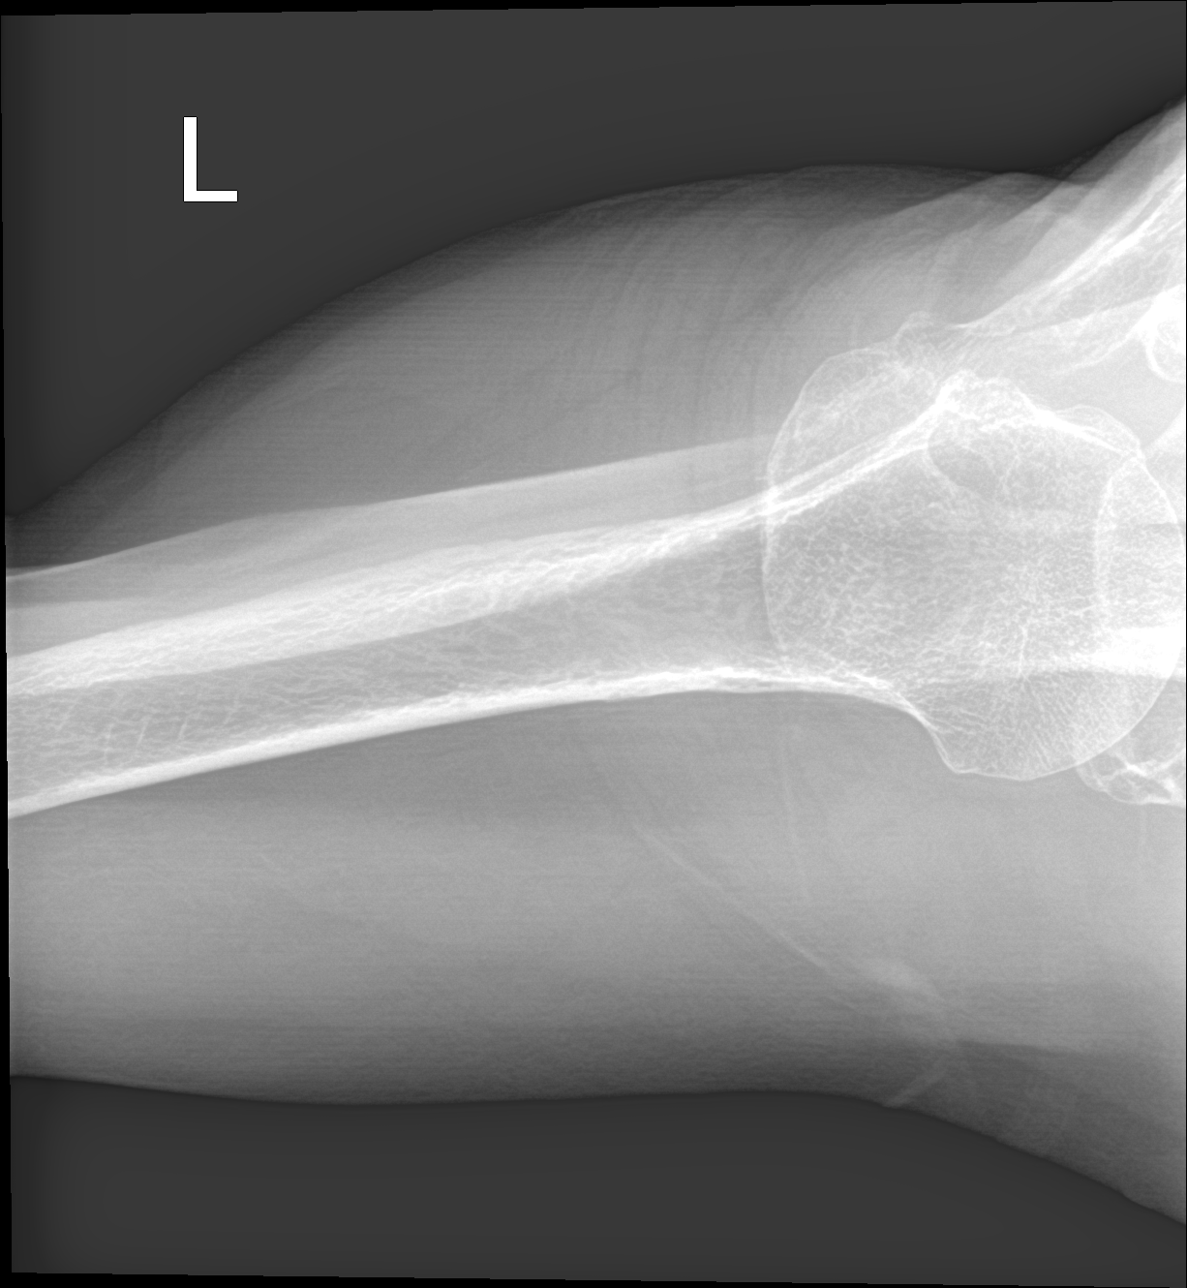

[shoulder axillary (2 of 2)]
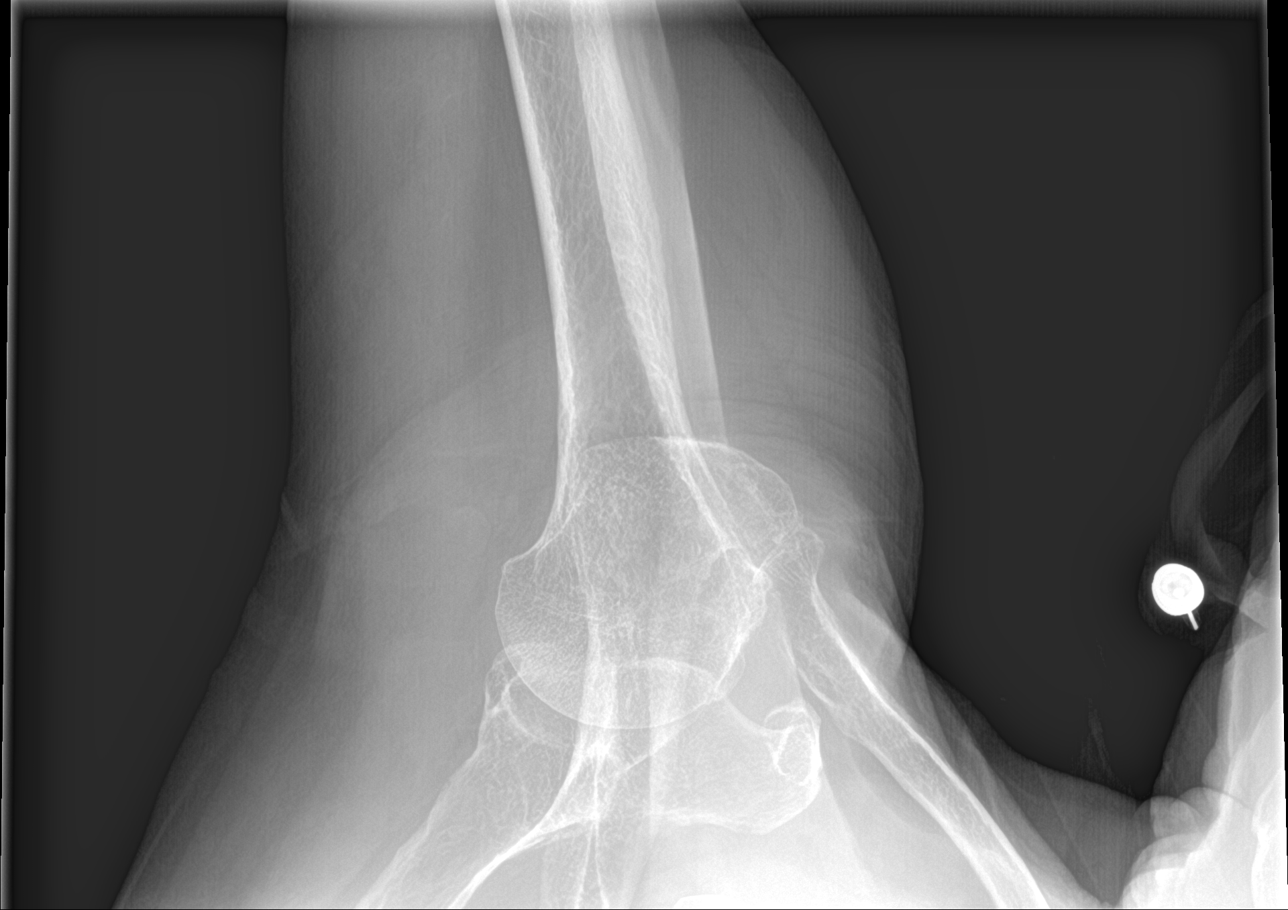

[4 of 4 positions shown; findings below may reference images not displayed]

FINDINGS: The bones are osteopenic. No fracture or dislocation. Mild
glenohumeral osteoarthrosis.
IMPRESSION: No fracture or dislocation of the left shoulder.
